# Patient Record
Sex: Female | Born: 1980 | Race: White | Hispanic: No | Marital: Married | Smoking: Never smoker
Health system: Southern US, Community
[De-identification: ages and names within clinical notes are randomized; demographics above are authoritative.]

## PROBLEM LIST (undated history)

## (undated) ENCOUNTER — Inpatient Hospital Stay (HOSPITAL_COMMUNITY): Payer: Self-pay

## (undated) DIAGNOSIS — Z8619 Personal history of other infectious and parasitic diseases: Secondary | ICD-10-CM

## (undated) DIAGNOSIS — E049 Nontoxic goiter, unspecified: Secondary | ICD-10-CM

## (undated) DIAGNOSIS — F419 Anxiety disorder, unspecified: Secondary | ICD-10-CM

## (undated) DIAGNOSIS — E039 Hypothyroidism, unspecified: Secondary | ICD-10-CM

## (undated) DIAGNOSIS — Z8742 Personal history of other diseases of the female genital tract: Secondary | ICD-10-CM

## (undated) HISTORY — PX: WISDOM TOOTH EXTRACTION: SHX21

## (undated) HISTORY — DX: Personal history of other diseases of the female genital tract: Z87.42

## (undated) HISTORY — DX: Personal history of other infectious and parasitic diseases: Z86.19

## (undated) HISTORY — PX: OOPHORECTOMY: SHX86

---

## 2010-06-11 ENCOUNTER — Inpatient Hospital Stay (HOSPITAL_COMMUNITY)
Admission: AD | Admit: 2010-06-11 | Discharge: 2010-06-14 | Payer: Self-pay | Source: Home / Self Care | Attending: Obstetrics and Gynecology | Admitting: Obstetrics and Gynecology

## 2010-06-12 ENCOUNTER — Encounter (INDEPENDENT_AMBULATORY_CARE_PROVIDER_SITE_OTHER): Payer: Self-pay | Admitting: Obstetrics and Gynecology

## 2010-06-20 LAB — URINE CULTURE
Colony Count: NO GROWTH
Culture  Setup Time: 201201082210
Culture: NO GROWTH
Special Requests: NEGATIVE

## 2010-06-20 LAB — CBC
HCT: 39 % (ref 36.0–46.0)
HCT: 28.2 % — ABNORMAL LOW (ref 36.0–46.0)
Hemoglobin: 13.5 g/dL (ref 12.0–15.0)
Hemoglobin: 9.4 g/dL — ABNORMAL LOW (ref 12.0–15.0)
MCH: 32.9 pg (ref 26.0–34.0)
MCH: 32.1 pg (ref 26.0–34.0)
MCHC: 34.6 g/dL (ref 30.0–36.0)
MCHC: 33.3 g/dL (ref 30.0–36.0)
MCV: 95.1 fL (ref 78.0–100.0)
MCV: 96.2 fL (ref 78.0–100.0)
Platelets: 208 10*3/uL (ref 150–400)
Platelets: 176 10*3/uL (ref 150–400)
RBC: 4.1 MIL/uL (ref 3.87–5.11)
RBC: 2.93 MIL/uL — ABNORMAL LOW (ref 3.87–5.11)
RDW: 13.5 % (ref 11.5–15.5)
RDW: 13.7 % (ref 11.5–15.5)
WBC: 12.1 10*3/uL — ABNORMAL HIGH (ref 4.0–10.5)
WBC: 17.9 10*3/uL — ABNORMAL HIGH (ref 4.0–10.5)

## 2010-06-20 LAB — RPR: RPR Ser Ql: NONREACTIVE

## 2010-06-20 LAB — ABO/RH: ABO/RH(D): O POS

## 2010-07-22 ENCOUNTER — Other Ambulatory Visit: Payer: Self-pay | Admitting: Obstetrics and Gynecology

## 2010-07-28 ENCOUNTER — Ambulatory Visit (HOSPITAL_COMMUNITY)
Admission: RE | Admit: 2010-07-28 | Discharge: 2010-07-28 | Disposition: A | Discharge status: Home / Self Care | Payer: BC Managed Care – PPO | Source: Ambulatory Visit | Attending: Obstetrics and Gynecology | Admitting: Obstetrics and Gynecology

## 2010-07-28 ENCOUNTER — Other Ambulatory Visit: Payer: Self-pay | Admitting: Obstetrics and Gynecology

## 2010-07-28 DIAGNOSIS — N83209 Unspecified ovarian cyst, unspecified side: Secondary | ICD-10-CM | POA: Insufficient documentation

## 2010-07-28 LAB — CBC
HCT: 41 % (ref 36.0–46.0)
Hemoglobin: 13.8 g/dL (ref 12.0–15.0)
MCH: 31.5 pg (ref 26.0–34.0)
MCHC: 33.7 g/dL (ref 30.0–36.0)
MCV: 93.6 fL (ref 78.0–100.0)
Platelets: 322 10*3/uL (ref 150–400)
RBC: 4.38 MIL/uL (ref 3.87–5.11)
RDW: 11.5 % (ref 11.5–15.5)
WBC: 6 10*3/uL (ref 4.0–10.5)

## 2010-07-28 LAB — SURGICAL PCR SCREEN
MRSA, PCR: NEGATIVE
Staphylococcus aureus: NEGATIVE

## 2010-08-18 NOTE — Op Note (Signed)
Stephanie Thompson, Stephanie Thompson            ACCOUNT NO.:  [id number]  MEDICAL RECORD NO.:  [id number]           PATIENT TYPE:  O  LOCATION:  [facility]                          [facility]  PHYSICIAN:  IT trainer, M.D.DATE OF BIRTH:  Apr 03, 1981  DATE OF PROCEDURE:  07/28/2010 DATE OF DISCHARGE:                              OPERATIVE REPORT   PREOPERATIVE DIAGNOSIS:  Left complex ovarian cyst.  POSTOPERATIVE DIAGNOSIS:  Left complex ovarian cyst.  PROCEDURE:  Robotic assisted left salpingo-oophorectomy.  ANESTHESIA:  General.  SURGEON:  Maxie Better, M.D.  PROCTOR:  Genia Del, M.D.  INDICATIONS:  A 30-year gravida 1, para 1 married white female who is about 6-1/2 weeks postpartum who presents now for surgical management of a left complex ovarian cyst.  The patient was followed during her pregnancy for what appeared to have been a simple ovarian cyst which had remained stable throughout its course.  At a postpartum visit, the patient underwent an ultrasound for followup and it revealed a 4.5 cm complex cyst with low echo.  The patient had a CA-125 done at that time which was 36.  Telephone consultation regarding the management with the GYN Oncologist Dr. Bridget Hartshorn concurred with the patient being taken to the operating room for surgical management.  The patient had been asymptomatic was no family history of ovarian cancer.  Risk of the procedure was reviewed with the patient, consent was signed, the patient was transferred to the operating room.  PROCEDURE:  Under adequate general anesthesia, the patient is placed in the dorsal lithotomy position.  Examination under anesthesia revealed slightly enlarge uterus, which was anterior.  There was a left adnexal fullness but no defined mass.  Right adnexa was without any lesion.  The patient was sterilely prepped with ChloraPrep and subsequently Betadine prep for the lower portion with an indwelling Foley  catheter subsequently placed.  She was then sterilely draped.  A weighted speculum was then placed in the vagina.  Sims retractor was used anteriorly.  The cervix which was parous was grasped with a single-tooth tenaculum.  The uterus sounded to 10 cm.  A medium KOH ring and its accompanied #10 introducer was inserted into the uterine cavity without incident.  The balloon was filled.  The KOH ring had been seated around the cervix very carefully.  The instruments from the vagina was otherwise removed.  The attention was then turned to the abdomen after maintaining still sterile technique.  A supraumbilical incision was then made.  A Veress needle was introduced.  Opening pressure of 10 was noted, about 3.5 L of CO2 was insufflated.  The Veress needle was removed after having been tested.  A 12 mm non-bladed trocar was attempted to be inserted with some difficulty and therefore a bladed trocar of 12 mm was inserted and then removed and the 12 mm non-bladed trocar was inserted.  The camera port was then utilized to place through that site and inspection of the pelvis was notable for no underlying trauma to the organs.  Obvious inspection of the pelvis revealed the uterus appearing to be otherwise bulky but normal.  Small visualization of the left  ovary did not was suggest immediate presence of a cyst and the right ovary appeared normal.  At that point, a decision was made to place a second port.  A 0.25% Marcaine was injected 8 cm away from the supraumbilical site was then placed and with good placement, a probe was then utilized to further inspect the pelvis.  The ovary on the left was inspected, it was noted to have a very elongated appearance with a clearly thin distal cystic mass giving the appearance of a clear fluid. No fluid in the cul-de-sac was noted.  The right tube and ovary was otherwise normal.  Decision was then made to continue with the procedure as scheduled.  The 3  additional sites was in place including a 12 mm assistant port on the right side of the patient with careful measurements.  Once all sites were identified as potential placement the trocars were placed under direct visualization.  The patient now was in deep Trendelenburg position.  The robot was then brought up to the patient's bedside docking to the left of the patient side docking specifically and the camera port were then attached.  A PK and the scissors and instruments were then placed through the ports 1 and 2 and a grasper was placed in the third site.  At that point, I went to the surgical console confirming the placement of the instruments in the right location as well which had been done previously.  Using the assistant port, the pelvis was irrigated with pelvic washings and 250 mL was removed for washings.  The procedure was then began by lifting up the left ovary with a grasper and using the monopolar cautery, making a linear incision to tease off the ovarian tissue from the cyst.  In doing so, there was incidental rupture of the cyst due to the thinness of the wall with clear serous like fluid present.  The site was further opened and grasping up the cyst wall using traction and counter traction with the instruments, the cyst wall was then finally removed.  Deep into the cyst itself, there was evidence of some projections, initially thought to be a small amount but as the cystectomy was being performed and opening further of the cavity, it was then noted that there was more of these front like projections.  At that point, a decision was then made to proceed with a salpingo-oophorectomy.  The ureter was identified. The tube and ovary on the left was brought up further and the infundibulopelvic ligament was serially clamped, cauterized with a PL and cut with monopolar scissors until the left tube and ovary was removed in its entirety.  An endobag was then used to place through  the assistant port and the specimen removed by that needs.  Good hemostasis subsequently noted.  The abdomen was once again inspected, anterior and posterior cul-de-sac was also noted to be without any specific findings. The right tube and ovary was once again inspected and appeared to be normal.  The appendix was noted to be normal.  At that point, we re- scrubbed to present back into case as the robotic portion was completed. The robot was then undocked and moved.  The camera remained and the pelvis inspected.  The abdomen was then copiously irrigated and suctioned with good hemostasis noted.  The procedure was felt to be complete.  The lateral port sites were then removed under direct visualization.  The camera ports was then removed.  The two 12-mm sites fascial closure  was done with 0 Vicryl figure-of-eight sutures and subcuticular closure of the skin with all the ports with 4-0 Vicryl followed by Dermabond.  The instruments in the vagina was also removed without incident.  SPECIMEN:  The pelvic washings.  The left tube and ovary sent to pathology.  ESTIMATED BLOOD LOSS:  Minimal.  COMPLICATIONS:  None.  The patient tolerated the procedure well, was transferred to recovery in stable condition.     Maxie Better, M.D.     /MEDQ  D:  07/28/2010  T:  07/29/2010  Job:  [id number]  Electronically Signed by Nena Jordan Shaye Lagace M.D. on 08/18/2010 05:38:54 AM

## 2011-09-26 ENCOUNTER — Other Ambulatory Visit: Payer: Self-pay | Admitting: Internal Medicine

## 2011-09-26 ENCOUNTER — Ambulatory Visit (HOSPITAL_COMMUNITY)
Admission: RE | Admit: 2011-09-26 | Discharge: 2011-09-26 | Disposition: A | Discharge status: Home / Self Care | Payer: BC Managed Care – PPO | Source: Ambulatory Visit | Attending: Internal Medicine | Admitting: Internal Medicine

## 2011-09-26 DIAGNOSIS — E049 Nontoxic goiter, unspecified: Secondary | ICD-10-CM

## 2011-09-26 DIAGNOSIS — E041 Nontoxic single thyroid nodule: Secondary | ICD-10-CM | POA: Insufficient documentation

## 2011-09-28 ENCOUNTER — Other Ambulatory Visit: Payer: Self-pay | Admitting: Internal Medicine

## 2011-09-28 DIAGNOSIS — E041 Nontoxic single thyroid nodule: Secondary | ICD-10-CM

## 2011-10-02 ENCOUNTER — Ambulatory Visit (HOSPITAL_COMMUNITY)
Admission: RE | Admit: 2011-10-02 | Discharge: 2011-10-02 | Disposition: A | Discharge status: Home / Self Care | Payer: BC Managed Care – PPO | Source: Ambulatory Visit | Attending: Internal Medicine | Admitting: Internal Medicine

## 2011-10-02 DIAGNOSIS — E049 Nontoxic goiter, unspecified: Secondary | ICD-10-CM | POA: Insufficient documentation

## 2011-10-02 DIAGNOSIS — E041 Nontoxic single thyroid nodule: Secondary | ICD-10-CM

## 2012-02-17 ENCOUNTER — Encounter (HOSPITAL_COMMUNITY): Payer: Self-pay | Admitting: *Deleted

## 2012-02-17 ENCOUNTER — Inpatient Hospital Stay (HOSPITAL_COMMUNITY)
Admission: AD | Admit: 2012-02-17 | Discharge: 2012-02-17 | Disposition: A | Discharge status: Home / Self Care | Payer: BC Managed Care – PPO | Source: Ambulatory Visit | Attending: Obstetrics and Gynecology | Admitting: Obstetrics and Gynecology

## 2012-02-17 ENCOUNTER — Inpatient Hospital Stay (HOSPITAL_COMMUNITY): Payer: BC Managed Care – PPO

## 2012-02-17 DIAGNOSIS — O200 Threatened abortion: Secondary | ICD-10-CM

## 2012-02-17 HISTORY — DX: Nontoxic goiter, unspecified: E04.9

## 2012-02-17 LAB — CBC
HCT: 37.4 % (ref 36.0–46.0)
Hemoglobin: 12.8 g/dL (ref 12.0–15.0)
MCH: 31.5 pg (ref 26.0–34.0)
MCHC: 34.2 g/dL (ref 30.0–36.0)
MCV: 92.1 fL (ref 78.0–100.0)
Platelets: 313 10*3/uL (ref 150–400)
RBC: 4.06 MIL/uL (ref 3.87–5.11)
RDW: 11.6 % (ref 11.5–15.5)
WBC: 10.1 10*3/uL (ref 4.0–10.5)

## 2012-02-17 LAB — HCG, QUANTITATIVE, PREGNANCY: hCG, Beta Chain, Quant, S: 92949 m[IU]/mL — ABNORMAL HIGH (ref <5)

## 2012-02-17 NOTE — MAU Note (Signed)
Pt reports having some spotting that started on Thursday was more brown and now pink. Denies pain or cramping.

## 2012-02-17 NOTE — MAU Provider Note (Signed)
History     CSN: [id number]  Arrival date and time: 02/17/12 1326 On unit - aware of patient arrival - ready for provider exam ~1400 First Provider Initiated Contact with Patient 02/17/12 1412      Chief Complaint  Patient presents with  . Vaginal Bleeding   HPI  Brown spotting [place] beach.  Spotting transitioned to pink spotting until this morning.  No cramping or pain.  No intercourse in 2 weeks.  Past Medical History  Diagnosis Date  . Enlarged thyroid     Past Surgical History  Procedure Date  . Oophorectomy             2012 -left History reviewed. No pertinent family history.  History  Substance Use Topics  . Smoking status: Never Smoker   . Smokeless tobacco: Not on file  . Alcohol Use: No    Allergies: No Known Allergies  Prescriptions prior to admission  Medication Sig Dispense Refill  . calcium carbonate (OS-CAL) 600 MG TABS Take 600 mg by mouth every morning.      . docusate sodium (COLACE) 100 MG capsule Take 100 mg by mouth daily as needed. For constipation      . ondansetron (ZOFRAN-ODT) 8 MG disintegrating tablet Take 8 mg by mouth daily as needed. For nausea      . Prenatal Vit-Fe Fumarate-FA (PRENATAL MULTIVITAMIN) TABS Take 1 tablet by mouth daily.        ROS Physical Exam   Blood pressure 123/71, pulse 81, temperature 98.2 F (36.8 C), temperature source Oral, resp. rate 18, height 5\' 4"  (1.626 m), weight 67.223 kg (148 lb 3.2 oz), last menstrual period 12/16/2011.  Physical Exam General: A&O x 3, anxious Abdomen: soft, non-tender, no guarding Pelvic: scant pinkish, brown d/c noted on SSE, no frank bleeding from cervical os, cervix deviated to patient's LT, cervix pink, smooth, appears closed Bimanual Exam: no CMT, uterus 9 week size, smooth, no palpable masses FHTs unaudible with doppler  Blood type: O pos per [facility] lab (2012)   MAU Course  Procedures Ultrasound:RADIOLOGY REPORT*  Clinical Data: 31 year old G2 P1,  LMP 12/16/2011 (9 weeks 0 days),  presenting with vaginal spotting over the past 2 days. No audible  fetal heart tones at physical examination.  OBSTETRIC <14 WK ULTRASOUND  Technique: Transabdominal ultrasound was performed for evaluation  of the gestation as well as the maternal uterus and adnexal  regions.  Comparison: None.  Intrauterine gestational sac: Single, normal in appearance.  Yolk sac: Visualized.  Embryo: Visualized.  Cardiac Activity: Visualized.  Heart Rate: 190 bpm  CRL: 28.5 mm 9 w 5 d [place] EDC: 09/16/2012.  Maternal uterus/Adnexae:  Normal-appearing right ovary measuring approximately 3.6 x 2.7 x  2.5 cm, containing an approximate 2.5 cm simple cyst. Left ovary  surgically absent. No adnexal masses or free pelvic fluid.  IMPRESSION:  1. Single live intrauterine embryo with estimated gestational age  [redacted] weeks 5 days by crown-rump length. Ultrasound [facility] 09/16/2012.  This correlates well with the estimated gestational age by LMP of 9  weeks 0 days.  2. Normal-appearing right ovary containing a 2.5 cm corpus luteum  cyst. Surgically absent left ovary.  3. No adnexal masses or free pelvic fluid.    Assessment and Plan  Threatened Abortion  Plan: Beta HcG - pending CBC: 10.1-12.8 - 37.4 - 313 Pelvic Ultrasound : IUP at 9 weeks and 5 days / CLC on right / no evidence of [facility]  Expectant management  Miscarriage precautions Discharge to home  Marlinda Mike 02/17/2012, 2:20 PM  Exam by: Raelyn Mora, SNM

## 2012-02-17 NOTE — MAU Note (Signed)
Pt presents to MAU with chief complaint of vagina spotting. Pt is a G2P1 at [redacted]w[redacted]d; says the spotting started on Thursday, brown, scant amount. Pt denies cramping/abdominal pain. Pt called the office on Thurs and the nurse told her to monitor the spotting and if it got worse to call back. Pt says this morning she woke up and the spotting had increased and the color is now pink.

## 2012-02-21 LAB — OB RESULTS CONSOLE ANTIBODY SCREEN: Antibody Screen: NEGATIVE

## 2012-02-21 LAB — OB RESULTS CONSOLE ABO/RH

## 2012-02-21 LAB — OB RESULTS CONSOLE RUBELLA ANTIBODY, IGM: Rubella: IMMUNE

## 2012-02-21 LAB — OB RESULTS CONSOLE HIV ANTIBODY (ROUTINE TESTING): HIV: NONREACTIVE

## 2012-02-29 LAB — OB RESULTS CONSOLE GC/CHLAMYDIA
Chlamydia: NEGATIVE
Gonorrhea: NEGATIVE

## 2012-06-05 NOTE — L&D Delivery Note (Signed)
Delivery Note At 6:40 PM a viable and healthy female was delivered via Vaginal, Spontaneous Delivery (Presentation: Right Occiput Anterior).  APGAR: 8, 9; weight . pending  Placenta status: Intact, Spontaneous.  Cord: 3 vessels with the following complications: None.  Cord pH: not sent  Anesthesia: Epidural  Episiotomy: None Lacerations: 2nd degree;Perineal Suture Repair: 3.0 vicryl rapide Est. Blood Loss (mL): 350 Prophylactic 800 mcg Cytotec placed rectally due to prior h/o PPH'age per pt and LGA baby.   Mom to postpartum.  Baby to with mother, stable. Marland Kitchen  Oral Hallgren R 09/13/2012, 7:13 PM

## 2012-07-21 ENCOUNTER — Encounter (HOSPITAL_COMMUNITY): Payer: Self-pay | Admitting: *Deleted

## 2012-07-21 ENCOUNTER — Inpatient Hospital Stay (HOSPITAL_COMMUNITY)
Admission: AD | Admit: 2012-07-21 | Discharge: 2012-07-21 | Disposition: A | Discharge status: Home / Self Care | Payer: BC Managed Care – PPO | Source: Ambulatory Visit | Attending: Obstetrics and Gynecology | Admitting: Obstetrics and Gynecology

## 2012-07-21 DIAGNOSIS — R197 Diarrhea, unspecified: Secondary | ICD-10-CM | POA: Insufficient documentation

## 2012-07-21 DIAGNOSIS — R109 Unspecified abdominal pain: Secondary | ICD-10-CM | POA: Insufficient documentation

## 2012-07-21 DIAGNOSIS — O212 Late vomiting of pregnancy: Secondary | ICD-10-CM | POA: Insufficient documentation

## 2012-07-21 LAB — URINE MICROSCOPIC-ADD ON

## 2012-07-21 LAB — COMPREHENSIVE METABOLIC PANEL
ALT: 15 U/L (ref 0–35)
AST: 19 U/L (ref 0–37)
Albumin: 2.7 g/dL — ABNORMAL LOW (ref 3.5–5.2)
Alkaline Phosphatase: 92 U/L (ref 39–117)
BUN: 7 mg/dL (ref 6–23)
CO2: 20 mEq/L (ref 19–32)
Calcium: 8.2 mg/dL — ABNORMAL LOW (ref 8.4–10.5)
Chloride: 101 mEq/L (ref 96–112)
Creatinine, Ser: 0.5 mg/dL (ref 0.50–1.10)
GFR calc Af Amer: >90 mL/min (ref >90)
GFR calc non Af Amer: >90 mL/min (ref >90)
Glucose, Bld: 115 mg/dL — ABNORMAL HIGH (ref 70–99)
Potassium: 3.1 mEq/L — ABNORMAL LOW (ref 3.5–5.1)
Sodium: 135 mEq/L (ref 135–145)
Total Bilirubin: 0.7 mg/dL (ref 0.3–1.2)
Total Protein: 5.8 g/dL — ABNORMAL LOW (ref 6.0–8.3)

## 2012-07-21 LAB — CBC WITH DIFFERENTIAL/PLATELET
Basophils Absolute: 0 10*3/uL (ref 0.0–0.1)
Basophils Relative: 0 % (ref 0–1)
Eosinophils Absolute: 0 10*3/uL (ref 0.0–0.7)
Eosinophils Relative: 0 % (ref 0–5)
HCT: 36.3 % (ref 36.0–46.0)
Hemoglobin: 12.4 g/dL (ref 12.0–15.0)
Lymphocytes Relative: 6 % — ABNORMAL LOW (ref 12–46)
Lymphs Abs: 0.8 10*3/uL (ref 0.7–4.0)
MCH: 32.5 pg (ref 26.0–34.0)
MCHC: 34.2 g/dL (ref 30.0–36.0)
MCV: 95.3 fL (ref 78.0–100.0)
Monocytes Absolute: 0.7 10*3/uL (ref 0.1–1.0)
Monocytes Relative: 6 % (ref 3–12)
Neutro Abs: 11.2 10*3/uL — ABNORMAL HIGH (ref 1.7–7.7)
Neutrophils Relative %: 88 % — ABNORMAL HIGH (ref 43–77)
Platelets: 216 10*3/uL (ref 150–400)
RBC: 3.81 MIL/uL — ABNORMAL LOW (ref 3.87–5.11)
RDW: 12.6 % (ref 11.5–15.5)
WBC: 12.8 10*3/uL — ABNORMAL HIGH (ref 4.0–10.5)

## 2012-07-21 LAB — URINALYSIS, ROUTINE W REFLEX MICROSCOPIC
Glucose, UA: NEGATIVE mg/dL
Hgb urine dipstick: NEGATIVE
Ketones, ur: >80 mg/dL — AB
Nitrite: NEGATIVE
Protein, ur: 30 mg/dL — AB
Specific Gravity, Urine: 1.025 (ref 1.005–1.030)
Urobilinogen, UA: 0.2 mg/dL (ref 0.0–1.0)
pH: 6.5 (ref 5.0–8.0)

## 2012-07-21 MED ORDER — ONDANSETRON 8 MG PO TBDP: 8.0000 mg | ORAL_TABLET | Freq: Two times a day (BID) | ORAL | Status: DC | PRN

## 2012-07-21 MED ORDER — LACTATED RINGERS IV SOLN
INTRAVENOUS | Status: DC
  Administered 2012-07-21: 18:00:00 via INTRAVENOUS

## 2012-07-21 MED ORDER — FAMOTIDINE IN NACL 20-0.9 MG/50ML-% IV SOLN
20.0000 mg | Freq: Once | INTRAVENOUS | Status: AC
  Administered 2012-07-21: 20 mg via INTRAVENOUS

## 2012-07-21 MED ORDER — ONDANSETRON 8 MG/NS 50 ML IVPB
8.0000 mg | Freq: Once | INTRAVENOUS | Status: AC
  Administered 2012-07-21: 8 mg via INTRAVENOUS
  Filled 2012-07-21: qty 8

## 2012-07-21 MED ORDER — KCL-LACTATED RINGERS 20 MEQ/L IV SOLN
INTRAVENOUS | Status: DC
  Administered 2012-07-21: 20:00:00 via INTRAVENOUS
  Filled 2012-07-21: qty 1000

## 2012-07-21 MED ORDER — RANITIDINE HCL 150 MG PO TABS: 150.0000 mg | ORAL_TABLET | Freq: Two times a day (BID) | ORAL | Status: DC

## 2012-07-21 MED ORDER — FAMOTIDINE IN NACL 20-0.9 MG/50ML-% IV SOLN
INTRAVENOUS | Status: AC
  Filled 2012-07-21: qty 50

## 2012-07-21 NOTE — MAU Note (Signed)
Stephanie Thompson is here with N/V/D that started last night. She has been up all night with the symptoms. She is 31w1; she has been sipping on stuff but complains of horrible upper abdominal pain, and tingling in her hands.

## 2012-07-21 NOTE — Progress Notes (Signed)
Nursing requested initial orders for this patient - Dr Ernestina Penna en route to evaluate. Orders placed   Marlinda Mike CNM

## 2012-07-21 NOTE — H&P (Signed)
Chief complaint: Nausea, vomiting, diarrhea, dehydration  History of present illness: This 32 year old G2 P1 at 31 weeks and 1 day presents with nausea vomiting diarrhea and dehydration. Patient notes symptoms started early this morning with decreased by mouth it intake. Symptoms worsened around noon today. Patient went all afternoon and early evening with very little by mouth intake. Several bouts of nausea emesis and diarrhea. Patient notes no fevers. Patient notes no bloody stools or blood in her urine. Patient notes no dysuria. Patient notes no fever. Patient notes no headache, no right upper quadrant pain, no vaginal bleeding. Good fetal movement. Occasional contraction, not painful. No complaints of swelling. Patient with no known sick contacts. Patient states throughout the day minimal urine output and overall feeling fatigued.  No known drug allergies Medications: Prenatal vitamins, Tums Allergies none Medical history: None Past surgical history: Foot surgery Past obstructive history: Vaginal delivery at 41 weeks Past GYN history: No history of LEEP  Physical exam:  Filed Vitals:   07/21/12 1812 07/21/12 1830 07/21/12 1850 07/21/12 2016  BP:    112/68  Pulse: 105 95 98 100  Temp:      TempSrc:      Resp:      Weight:      SpO2: 99% 100% 99%    General: Well-appearing, no distress  cardiovascular regular rate and rhythm Pulmonary: Clear to auscultation bilaterally Back: No costovertebral angle tenderness Abdomen: Gravid, nontender, no right upper quadrant pain, no rebound, no guarding GU: Cervix long closed, no cervical motion tenderness, no uterine tenderness Lower extremities: Nontender, no edema  NST: 150s, positive accelerations, no decelerations, 10 beat variability Tocometry: Regular contractions every 5 minutes (not felt by patient) versus irritability  CBC: Hemoglobin 12.4, white blood cell 12.8, 88% neutrophils UA: 80 of ketones, specific gravity 1.025, 30 CMP:  Potassium 3.1, LFTs normal with the exception of low albumin  Assessment and plan: 32 year old G2 P1 at 31 weeks this viral GI illness and subsequent dehydration with associated uterine irritability - Uterine irritability. No evidence of preterm labor, no risk factors for preterm labor, no cervical change. Patient given precautions to return if contractions are more painful, vaginal bleeding, leakage of fluid. - GI illness. Likely viral in nature. Patient was discharged home after IV hydration, tolerating by mouth. Patient was given Zantac and Zofran. Instructed to continue hydration at home - Hypokalemia. Repleted with IV fluids  Heran Campau A. 07/21/2012 9:26 PM

## 2012-07-23 LAB — URINE CULTURE: Colony Count: 60000

## 2012-08-14 ENCOUNTER — Other Ambulatory Visit: Payer: Self-pay | Admitting: Endocrinology

## 2012-08-14 DIAGNOSIS — E041 Nontoxic single thyroid nodule: Secondary | ICD-10-CM

## 2012-08-20 LAB — OB RESULTS CONSOLE GBS: GBS: NEGATIVE

## 2012-09-11 ENCOUNTER — Encounter (HOSPITAL_COMMUNITY): Payer: Self-pay | Admitting: *Deleted

## 2012-09-11 ENCOUNTER — Telehealth (HOSPITAL_COMMUNITY): Payer: Self-pay | Admitting: *Deleted

## 2012-09-11 NOTE — Telephone Encounter (Signed)
Preadmission screen  

## 2012-09-13 ENCOUNTER — Inpatient Hospital Stay (HOSPITAL_COMMUNITY)
Admission: AD | Admit: 2012-09-13 | Discharge: 2012-09-15 | DRG: 373 | Disposition: A | Discharge status: Home / Self Care | Payer: BC Managed Care – PPO | Source: Ambulatory Visit | Attending: Obstetrics and Gynecology | Admitting: Obstetrics and Gynecology

## 2012-09-13 ENCOUNTER — Inpatient Hospital Stay (HOSPITAL_COMMUNITY): Payer: BC Managed Care – PPO | Admitting: Anesthesiology

## 2012-09-13 ENCOUNTER — Other Ambulatory Visit: Payer: Self-pay | Admitting: Obstetrics and Gynecology

## 2012-09-13 ENCOUNTER — Encounter (HOSPITAL_COMMUNITY): Payer: Self-pay | Admitting: Obstetrics and Gynecology

## 2012-09-13 ENCOUNTER — Encounter (HOSPITAL_COMMUNITY): Payer: Self-pay | Admitting: Anesthesiology

## 2012-09-13 DIAGNOSIS — O3660X0 Maternal care for excessive fetal growth, unspecified trimester, not applicable or unspecified: Secondary | ICD-10-CM | POA: Diagnosis present

## 2012-09-13 DIAGNOSIS — K219 Gastro-esophageal reflux disease without esophagitis: Secondary | ICD-10-CM | POA: Diagnosis present

## 2012-09-13 LAB — CBC
HCT: 39.3 % (ref 36.0–46.0)
Hemoglobin: 13.7 g/dL (ref 12.0–15.0)
MCH: 32.6 pg (ref 26.0–34.0)
MCHC: 34.9 g/dL (ref 30.0–36.0)

## 2012-09-13 MED ORDER — EPHEDRINE 5 MG/ML INJ
10.0000 mg | INTRAVENOUS | Status: DC | PRN
  Filled 2012-09-13: qty 4
  Filled 2012-09-13: qty 2

## 2012-09-13 MED ORDER — LANOLIN HYDROUS EX OINT: TOPICAL_OINTMENT | CUTANEOUS | Status: DC | PRN

## 2012-09-13 MED ORDER — SIMETHICONE 80 MG PO CHEW: 80.0000 mg | CHEWABLE_TABLET | ORAL | Status: DC | PRN

## 2012-09-13 MED ORDER — LACTATED RINGERS IV SOLN: INTRAVENOUS | Status: DC

## 2012-09-13 MED ORDER — LACTATED RINGERS IV SOLN: 500.0000 mL | INTRAVENOUS | Status: DC | PRN

## 2012-09-13 MED ORDER — DIBUCAINE 1 % RE OINT: 1.0000 "application " | TOPICAL_OINTMENT | RECTAL | Status: DC | PRN

## 2012-09-13 MED ORDER — EPHEDRINE 5 MG/ML INJ
10.0000 mg | INTRAVENOUS | Status: DC | PRN
  Filled 2012-09-13: qty 2

## 2012-09-13 MED ORDER — PRENATAL MULTIVITAMIN CH
1.0000 | ORAL_TABLET | Freq: Every day | ORAL | Status: DC
  Administered 2012-09-14: 1 via ORAL
  Filled 2012-09-13 (×2): qty 1

## 2012-09-13 MED ORDER — PHENYLEPHRINE 40 MCG/ML (10ML) SYRINGE FOR IV PUSH (FOR BLOOD PRESSURE SUPPORT)
80.0000 ug | PREFILLED_SYRINGE | INTRAVENOUS | Status: DC | PRN
  Filled 2012-09-13: qty 5
  Filled 2012-09-13: qty 2

## 2012-09-13 MED ORDER — DIPHENHYDRAMINE HCL 25 MG PO CAPS: 25.0000 mg | ORAL_CAPSULE | Freq: Four times a day (QID) | ORAL | Status: DC | PRN

## 2012-09-13 MED ORDER — OXYCODONE-ACETAMINOPHEN 5-325 MG PO TABS: 1.0000 | ORAL_TABLET | ORAL | Status: DC | PRN

## 2012-09-13 MED ORDER — FENTANYL 2.5 MCG/ML BUPIVACAINE 1/10 % EPIDURAL INFUSION (WH - ANES)
14.0000 mL/h | INTRAMUSCULAR | Status: DC | PRN
  Filled 2012-09-13: qty 125

## 2012-09-13 MED ORDER — DIPHENHYDRAMINE HCL 50 MG/ML IJ SOLN: 12.5000 mg | INTRAMUSCULAR | Status: DC | PRN

## 2012-09-13 MED ORDER — BENZOCAINE-MENTHOL 20-0.5 % EX AERO: 1.0000 "application " | INHALATION_SPRAY | CUTANEOUS | Status: DC | PRN

## 2012-09-13 MED ORDER — OXYTOCIN BOLUS FROM INFUSION
500.0000 mL | INTRAVENOUS | Status: DC
  Administered 2012-09-13: 500 mL via INTRAVENOUS

## 2012-09-13 MED ORDER — CITRIC ACID-SODIUM CITRATE 334-500 MG/5ML PO SOLN: 30.0000 mL | ORAL | Status: DC | PRN

## 2012-09-13 MED ORDER — ONDANSETRON HCL 4 MG/2ML IJ SOLN: 4.0000 mg | INTRAMUSCULAR | Status: DC | PRN

## 2012-09-13 MED ORDER — IBUPROFEN 600 MG PO TABS
600.0000 mg | ORAL_TABLET | Freq: Four times a day (QID) | ORAL | Status: DC
  Administered 2012-09-14 – 2012-09-15 (×4): 600 mg via ORAL
  Filled 2012-09-13 (×6): qty 1

## 2012-09-13 MED ORDER — MISOPROSTOL 200 MCG PO TABS
ORAL_TABLET | ORAL | Status: AC
  Administered 2012-09-13: 800 ug
  Filled 2012-09-13: qty 4

## 2012-09-13 MED ORDER — WITCH HAZEL-GLYCERIN EX PADS: 1.0000 "application " | MEDICATED_PAD | CUTANEOUS | Status: DC | PRN

## 2012-09-13 MED ORDER — ACETAMINOPHEN 325 MG PO TABS: 650.0000 mg | ORAL_TABLET | ORAL | Status: DC | PRN

## 2012-09-13 MED ORDER — LACTATED RINGERS IV SOLN
500.0000 mL | Freq: Once | INTRAVENOUS | Status: AC
  Administered 2012-09-13: 500 mL via INTRAVENOUS

## 2012-09-13 MED ORDER — LIDOCAINE HCL (PF) 1 % IJ SOLN
30.0000 mL | INTRAMUSCULAR | Status: DC | PRN
  Filled 2012-09-13: qty 30

## 2012-09-13 MED ORDER — LIDOCAINE HCL (PF) 1 % IJ SOLN
INTRAMUSCULAR | Status: AC
  Administered 2012-09-13: 30 mL via SUBCUTANEOUS
  Filled 2012-09-13: qty 30

## 2012-09-13 MED ORDER — ZOLPIDEM TARTRATE 5 MG PO TABS: 5.0000 mg | ORAL_TABLET | Freq: Every evening | ORAL | Status: DC | PRN

## 2012-09-13 MED ORDER — ONDANSETRON HCL 4 MG PO TABS: 4.0000 mg | ORAL_TABLET | ORAL | Status: DC | PRN

## 2012-09-13 MED ORDER — FLEET ENEMA 7-19 GM/118ML RE ENEM: 1.0000 | ENEMA | RECTAL | Status: DC | PRN

## 2012-09-13 MED ORDER — ONDANSETRON HCL 4 MG/2ML IJ SOLN: 4.0000 mg | Freq: Four times a day (QID) | INTRAMUSCULAR | Status: DC | PRN

## 2012-09-13 MED ORDER — IBUPROFEN 600 MG PO TABS: 600.0000 mg | ORAL_TABLET | Freq: Four times a day (QID) | ORAL | Status: DC | PRN

## 2012-09-13 MED ORDER — TETANUS-DIPHTH-ACELL PERTUSSIS 5-2.5-18.5 LF-MCG/0.5 IM SUSP: 0.5000 mL | Freq: Once | INTRAMUSCULAR | Status: DC

## 2012-09-13 MED ORDER — OXYTOCIN 40 UNITS IN LACTATED RINGERS INFUSION - SIMPLE MED
62.5000 mL/h | INTRAVENOUS | Status: DC
  Filled 2012-09-13: qty 1000

## 2012-09-13 MED ORDER — PHENYLEPHRINE 40 MCG/ML (10ML) SYRINGE FOR IV PUSH (FOR BLOOD PRESSURE SUPPORT)
80.0000 ug | PREFILLED_SYRINGE | INTRAVENOUS | Status: DC | PRN
  Filled 2012-09-13: qty 2

## 2012-09-13 MED ORDER — SENNOSIDES-DOCUSATE SODIUM 8.6-50 MG PO TABS
2.0000 | ORAL_TABLET | Freq: Every day | ORAL | Status: DC
  Administered 2012-09-13 – 2012-09-14 (×2): 2 via ORAL

## 2012-09-13 NOTE — MAU Note (Signed)
Spoke with Norva Riffle CN/BS and patient will be triaged in  Room 173. Patient ambulated to room 173.

## 2012-09-13 NOTE — Anesthesia Preprocedure Evaluation (Signed)
Anesthesia Evaluation  Patient identified by MRN, date of birth, ID band Patient awake    Reviewed: Allergy & Precautions, H&P , Patient's Chart, lab work & pertinent test results  Airway Mallampati: III TM Distance: >3 FB Neck ROM: full    Dental no notable dental hx. (+) Teeth Intact   Pulmonary neg pulmonary ROS,  breath sounds clear to auscultation  Pulmonary exam normal       Cardiovascular negative cardio ROS  Rhythm:regular Rate:Normal     Neuro/Psych negative neurological ROS  negative psych ROS   GI/Hepatic Neg liver ROS, GERD-  Medicated and Controlled,  Endo/Other  negative endocrine ROS  Renal/GU negative Renal ROS  negative genitourinary   Musculoskeletal   Abdominal Normal abdominal exam  (+)   Peds  Hematology negative hematology ROS (+)   Anesthesia Other Findings   Reproductive/Obstetrics (+) Pregnancy                          Anesthesia Physical Anesthesia Plan  ASA: II  Anesthesia Plan: Epidural   Post-op Pain Management:    Induction:   Airway Management Planned:   Additional Equipment:   Intra-op Plan:   Post-operative Plan:   Informed Consent: I have reviewed the patients History and Physical, chart, labs and discussed the procedure including the risks, benefits and alternatives for the proposed anesthesia with the patient or authorized representative who has indicated his/her understanding and acceptance.     Plan Discussed with: Anesthesiologist  Anesthesia Plan Comments:         Anesthesia Quick Evaluation   

## 2012-09-13 NOTE — OB Triage Note (Signed)
Patient admitted to 173 for triage to rule out active labor. Patient stated she had been contracting since 0630 this morning. They have become more frequent, and she timed them between 5 and 10 minutes apart and increasing in frequency. She made an appointment with Dr. Ernestina Penna and she was 4cm/50%/-2. She went home, and felt the contractions were stronger, so she came to MAU.

## 2012-09-13 NOTE — H&P (Signed)
Stephanie Thompson is a 32 y.o. female presenting at 38.6 wks in active labor.  G2P1001, prior term delivery of 8'9" and suspecting macrosomia with 9+ lbs with current pregnancy PNCare with Stephanie Thompson, starting at 8 wks. Overall uncomplicated with normal labs, Glucola nl x 2, sono noted EFW at 98% at 25 wks and has been measuring over 90%.   History OB History   Grav Para Term Preterm Abortions TAB SAB Ect Mult Living   2 1 1  0 0 0 0 0 0 1     Past Medical History  Diagnosis Date  . Enlarged thyroid   . GERD (gastroesophageal reflux disease)   . Hx of ovarian cyst   . Hx of varicella    Past Surgical History  Procedure Laterality Date  . Oophorectomy     Family History: family history includes Cancer in her father, maternal grandmother, and paternal grandmother and Hyperthyroidism in her mother. Social History:  reports that she has never smoked. She does not have any smokeless tobacco history on file. She reports that she does not drink alcohol or use illicit drugs.   Prenatal Transfer Tool  Maternal Diabetes: No Genetic Screening: Normal Maternal Ultrasounds/Referrals: Normal Fetal Ultrasounds or other Referrals:  None Maternal Substance Abuse:  No Significant Maternal Medications:  Meds include: Other: Ranitidine Significant Maternal Lab Results:  Lab values include: Group B Strep negative Other Comments:  None  ROS - good FMs, no loss of fluid, no bleeding. No other complaints.     Blood pressure 124/79, pulse 84, temperature 98.7 F (37.1 C), temperature source Oral, resp. rate 20, height 5\' 4"  (1.626 m), weight 182 lb (82.555 kg), last menstrual period 12/16/2011. Exam Physical Exam :  A&O x 3, no acute distress. Pleasant Lungs CTA bilat CV RRR, S1S2 normal Abdo soft, non tender, non acute Extr no edema/ tenderness Pelvic 4cm dilated per office exam FHT  140s/ + accels/ no decels/ moderate variability- category I Toco regular q 2-3 min, spontaneous   Prenatal  labs: ABO, Rh: O/Positive/-- (09/18 0000) Antibody: Negative (09/18 0000) Rubella: Immune (09/18 0000) RPR: Nonreactive (09/18 0000)  HBsAg: Negative (09/18 0000)  HIV: Non-reactive (09/18 0000)  GBS: Negative (03/18 0000)  Glucola 1st 138, repeat in few wks normal Genetic screening Anatomy sono- nl,  Several growth sono noted LGA at 98% (last sono at 37 wks noted 9 lbs)  Assessment/Plan: 32 yo, G2P1001, at 38.6 wks in spontaneous labor. Routine admission orders, GBS neg. Epidural desired, AROm after that. FHT category I   Stephanie Thompson R 09/13/2012, 5:48 PM

## 2012-09-14 LAB — CBC
HCT: 35.8 % — ABNORMAL LOW (ref 36.0–46.0)
Hemoglobin: 12.1 g/dL (ref 12.0–15.0)
MCHC: 33.8 g/dL (ref 30.0–36.0)
MCV: 93.7 fL (ref 78.0–100.0)

## 2012-09-14 MED ORDER — FAMOTIDINE 20 MG PO TABS
20.0000 mg | ORAL_TABLET | Freq: Two times a day (BID) | ORAL | Status: DC
  Administered 2012-09-14 – 2012-09-15 (×2): 20 mg via ORAL
  Filled 2012-09-14 (×2): qty 1

## 2012-09-14 NOTE — Progress Notes (Signed)
Post Partum Day 1 Subjective: no complaints, up ad lib, voiding and tolerating PO, cramps well controlled. Acid reflux noted, needs to restart Zantac/   Objective: Blood pressure 119/76, pulse 77, temperature 98 F (36.7 C), temperature source Oral, resp. rate 20, height 5\' 4"  (1.626 m), weight 182 lb (82.555 kg), last menstrual period 12/16/2011, SpO2 96.00%, unknown if currently breastfeeding.  Physical Exam:  General: alert, cooperative and appears stated age Lochia: appropriate Uterine Fundus: firm Incision: healing well DVT Evaluation: No evidence of DVT seen on physical exam.   Recent Labs  09/13/12 1714 09/14/12 0726  HGB 13.7 12.1  HCT 39.3 35.8*  Stable H/H.  Rh (+) Rub Imm  TDaP- not done in 3rd trimester but received one in Apr'2013, so she is deferring after discussing with Dr Cherly Hensen   Assessment/Plan: Plan for discharge tomorrow, Breastfeeding and Lactation consult Restart Zantac.    LOS: 1 day   Rudy Luhmann R 09/14/2012, 9:41 PM

## 2012-09-14 NOTE — Anesthesia Postprocedure Evaluation (Signed)
  Anesthesia Post-op Note  Patient: Stephanie Thompson  Procedure(s) Performed: * No procedures listed *  Patient Location: Mother/Baby  Anesthesia Type:Epidural  Level of Consciousness: awake, alert  and oriented  Airway and Oxygen Therapy: Patient Spontanous Breathing  Post-op Pain: mild  Post-op Assessment: Patient's Cardiovascular Status Stable, Respiratory Function Stable, Patent Airway, No signs of Nausea or vomiting, Pain level controlled, No headache, No residual numbness and No residual motor weakness  Post-op Vital Signs: stable  Complications: No apparent anesthesia complications

## 2012-09-15 MED ORDER — IBUPROFEN 600 MG PO TABS: 600.0000 mg | ORAL_TABLET | Freq: Four times a day (QID) | ORAL | Status: DC

## 2012-09-15 NOTE — Discharge Summary (Signed)
Obstetric Discharge Summary Reason for Admission: onset of labor Prenatal Procedures: none Intrapartum Procedures: spontaneous vaginal delivery Postpartum Procedures: none Complications-Operative and Postpartum: none Hemoglobin  Date Value Range Status  09/14/2012 12.1  12.0 - 15.0 g/dL Final     HCT  Date Value Range Status  09/14/2012 35.8* 36.0 - 46.0 % Final   Discharge Diagnoses: Term Pregnancy-delivered  Discharge Information: Date: 09/15/2012 Activity: unrestricted and pelvic rest Diet: routine Medications: Ibuprofen Condition: stable Instructions: refer to practice specific booklet Discharge to: home Follow-up Information   Follow up with [name],[name], MD In 6 weeks.   Contact information:   [address] [place] [address] [phone]       Newborn Data: Live born female  Birth Weight: 7 lb 15.3 oz (3610 g) APGAR: 8, 9  Home with motherz.  Stephanie Thompson 09/15/2012, 10:32 AM

## 2012-09-15 NOTE — Progress Notes (Signed)
Post Partum Day 2 Subjective: no complaints, up ad lib, voiding, tolerating PO and + flatus  No breast complaints.    Objective: Blood pressure 108/73, pulse 80, temperature 97.7 F (36.5 C), temperature source Oral, resp. rate 18, height 5\' 4"  (1.626 m), weight 182 lb (82.555 kg), last menstrual period 12/16/2011, SpO2 96.00%, unknown if currently breastfeeding.  Physical Exam:  General: alert and cooperative Lochia: appropriate Uterine Fundus: firm Incision: n/a DVT Evaluation: No evidence of DVT seen on physical exam.   Recent Labs  09/13/12 1714 09/14/12 0726  HGB 13.7 12.1  HCT 39.3 35.8*  Stable H/H  Assessment/Plan: Discharge home and Breastfeeding Postpartum care reviewed. F/up 6 wks.  TDaP 1 yr back, deferring today.    LOS: 2 days   Leani Myron R 09/15/2012, 10:27 AM

## 2012-09-16 MED ORDER — FENTANYL 2.5 MCG/ML BUPIVACAINE 1/10 % EPIDURAL INFUSION (WH - ANES)
INTRAMUSCULAR | Status: DC | PRN
  Administered 2012-09-13: 14 mL/h via EPIDURAL

## 2012-09-16 MED ORDER — LIDOCAINE HCL (PF) 1 % IJ SOLN
INTRAMUSCULAR | Status: DC | PRN
  Administered 2012-09-13 (×2): 4 mL

## 2012-09-16 NOTE — Anesthesia Procedure Notes (Signed)
Epidural Patient location during procedure: OB Start time: 09/13/2012 5:42 PM  Staffing Anesthesiologist: Yuliana Vandrunen A. Performed by: anesthesiologist   Preanesthetic Checklist Completed: patient identified, site marked, surgical consent, pre-op evaluation, timeout performed, IV checked, risks and benefits discussed and monitors and equipment checked  Epidural Patient position: sitting Prep: site prepped and draped and DuraPrep Patient monitoring: continuous pulse ox and blood pressure Approach: midline Injection technique: LOR air  Needle:  Needle type: Tuohy  Needle gauge: 17 G Needle length: 9 cm and 9 Needle insertion depth: 5 cm cm Catheter type: closed end flexible Catheter size: 19 Gauge Catheter at skin depth: 10 cm Test dose: negative and Other  Assessment Events: blood not aspirated, injection not painful, no injection resistance, negative IV test and no paresthesia  Additional Notes Patient identified. Risks and benefits discussed including failed block, incomplete  Pain control, post dural puncture headache, nerve damage, paralysis, blood pressure Changes, nausea, vomiting, reactions to medications-both toxic and allergic and post Partum back pain. All questions were answered. Patient expressed understanding and wished to proceed. Sterile technique was used throughout procedure. Epidural site was Dressed with sterile barrier dressing. No paresthesias, signs of intravascular injection Or signs of intrathecal spread were encountered.  Patient was more comfortable after the epidural was dosed. Please see RN's note for documentation of vital signs and FHR which are stable.

## 2012-09-21 ENCOUNTER — Inpatient Hospital Stay (HOSPITAL_COMMUNITY): Admission: RE | Admit: 2012-09-21 | Payer: BC Managed Care – PPO | Source: Ambulatory Visit

## 2012-10-21 ENCOUNTER — Other Ambulatory Visit: Payer: BC Managed Care – PPO

## 2013-05-04 IMAGING — US US SOFT TISSUE HEAD/NECK
1 series · 14 of 25 positions shown · non-contrast
Comparison: None.

CLINICAL DATA: Enlarged left upper thyroid

THYROID ULTRASOUND
TECHNIQUE: Ultrasound examination of the thyroid gland and adjacent
soft tissues was performed.

[Series 1: us soft tissue head/neck · 0.08mm/px · 14 of 49 slices shown]
[im 1/49]
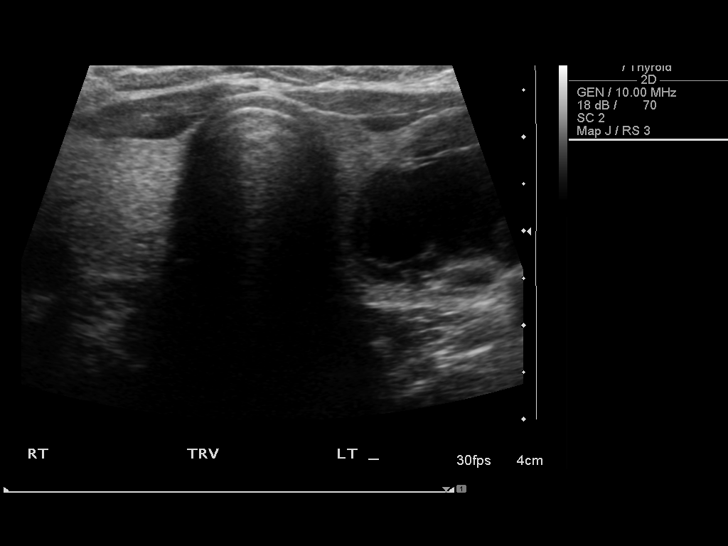
[im 5/49]
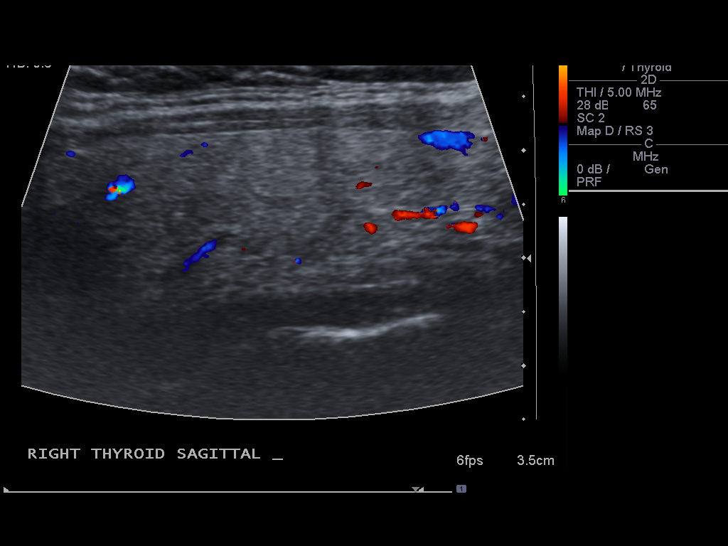
[im 9/49]
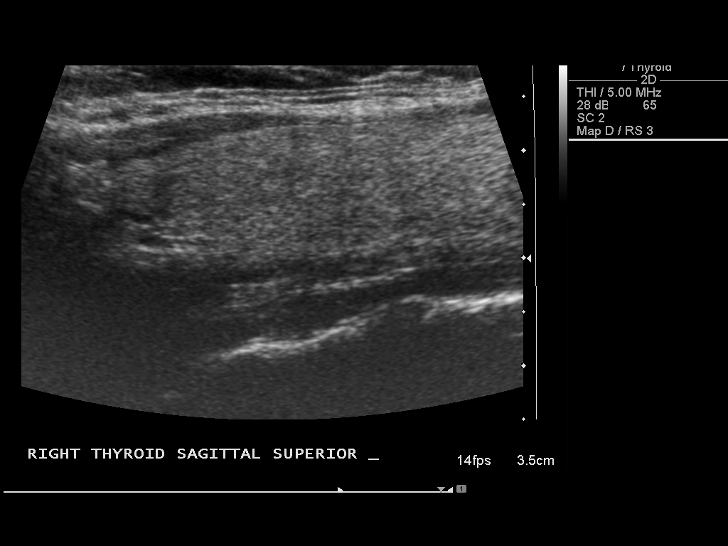
[im 13/49]
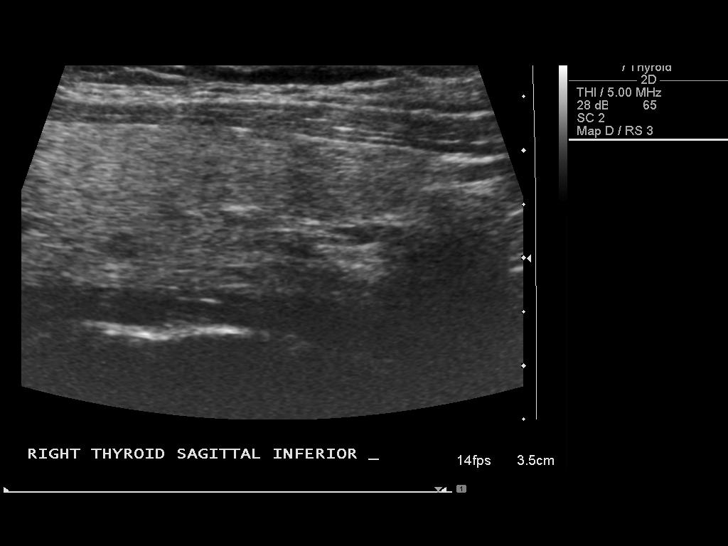
[im 17/49]
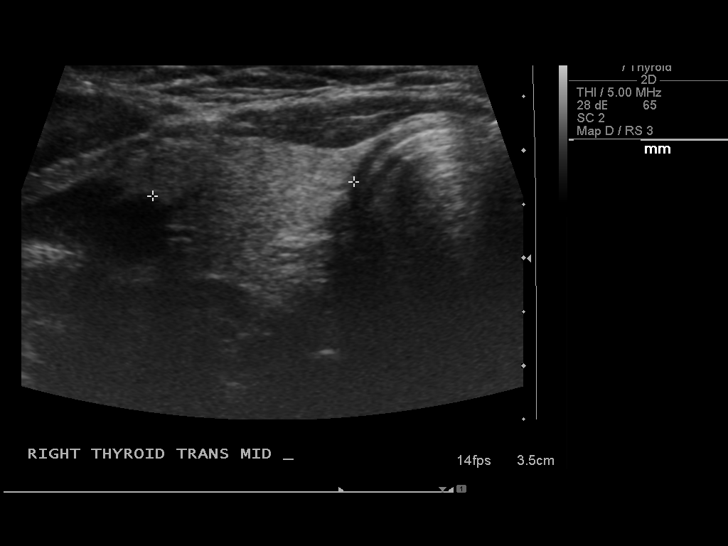
[im 19/49]
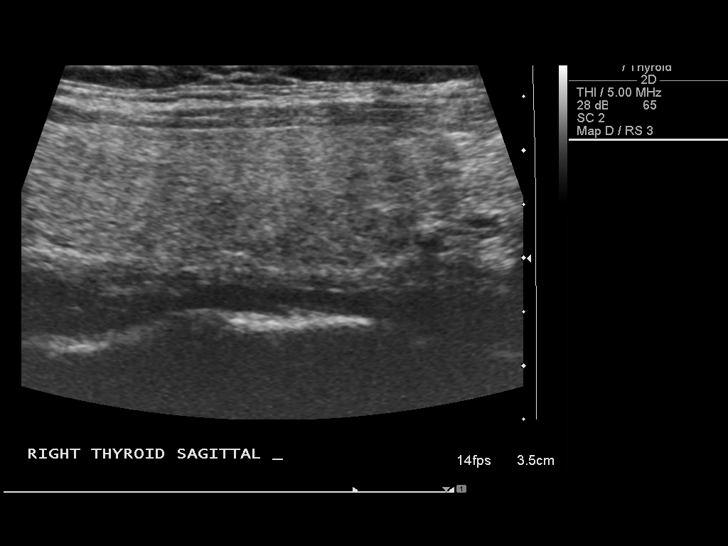
[im 23/49]
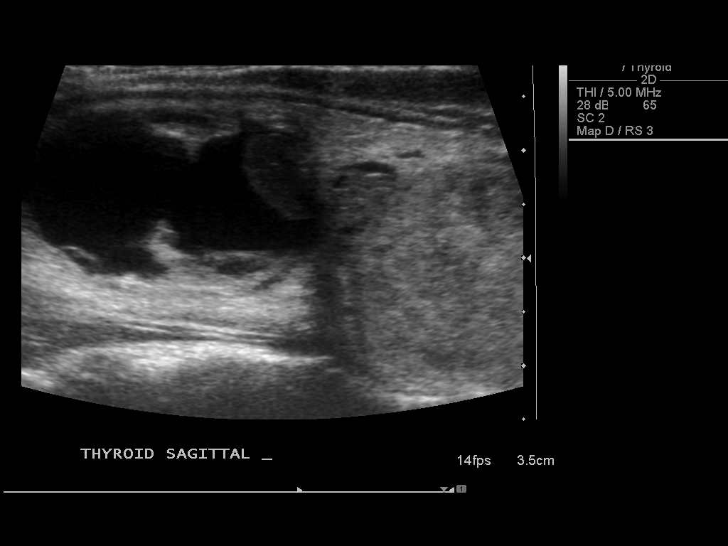
[im 27/49]
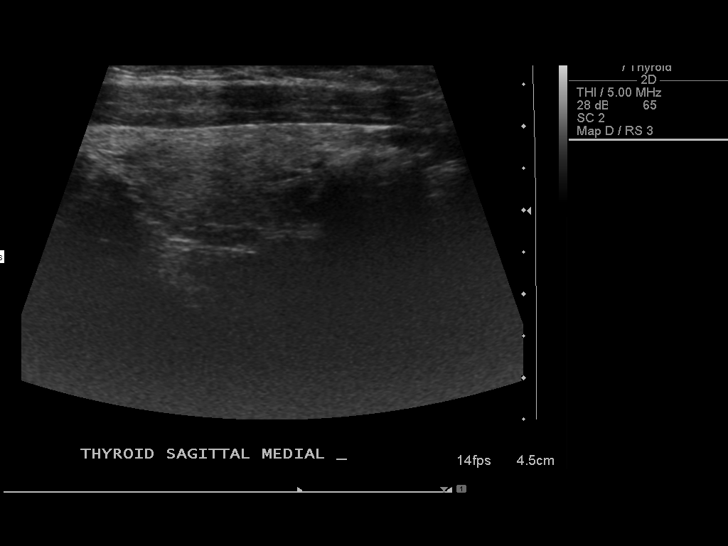
[im 31/49]
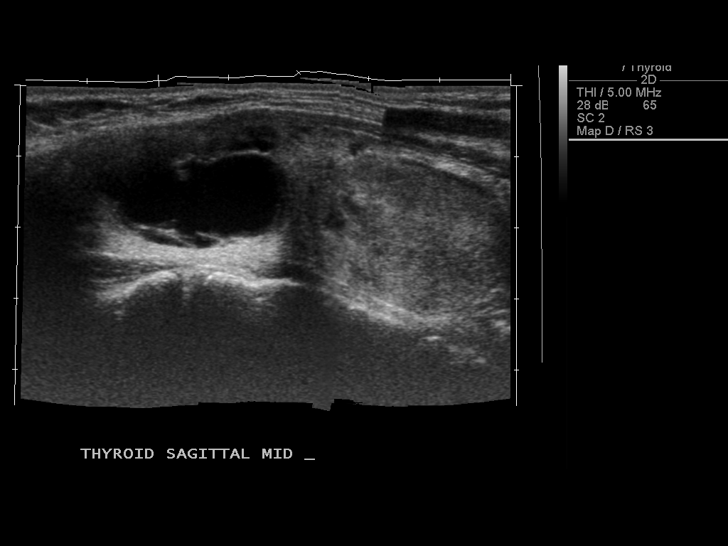
[im 33/49]
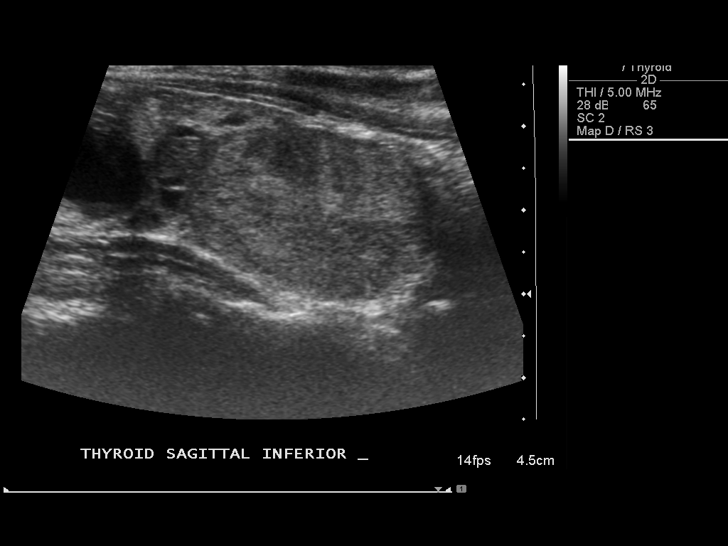
[im 37/49]
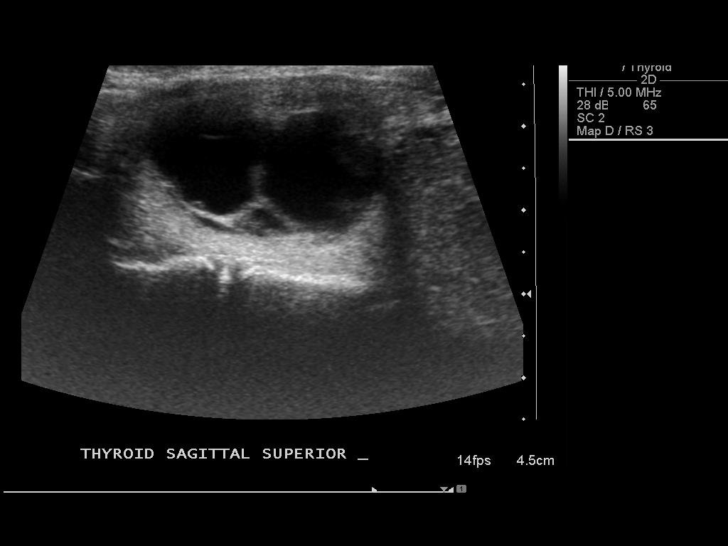
[im 41/49]
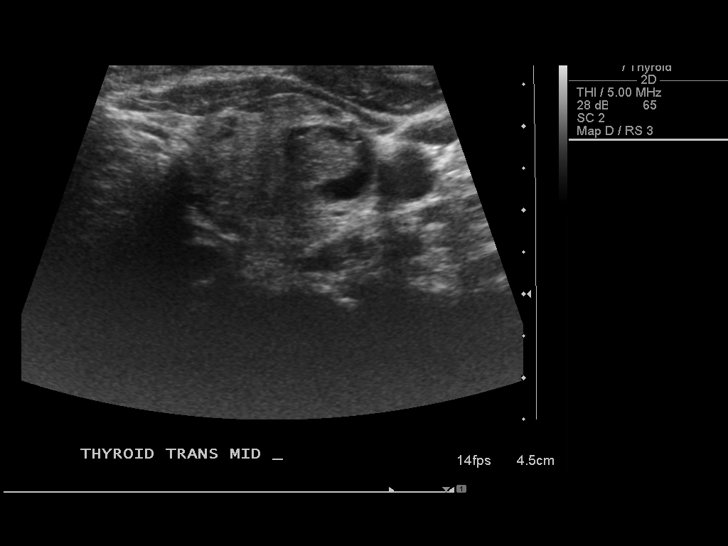
[im 45/49]
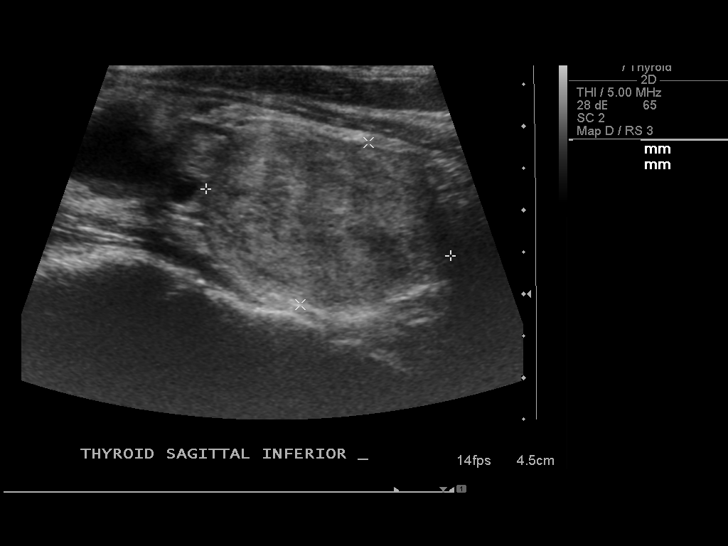
[im 49/49]
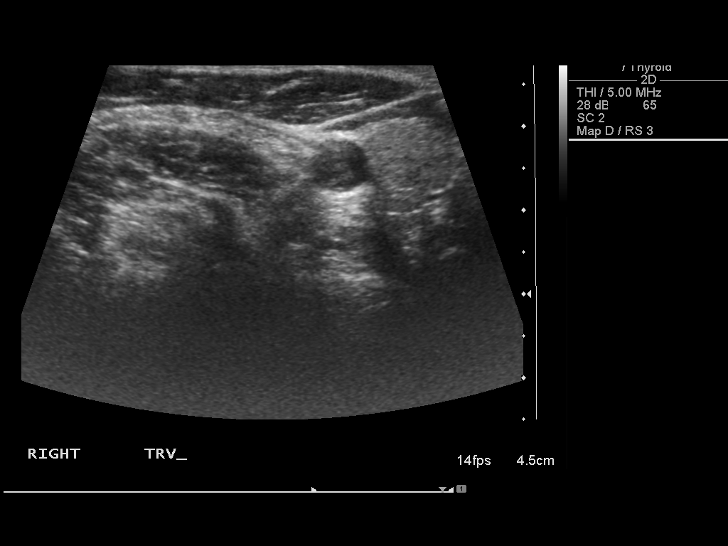

[14 of 25 positions shown; findings below may reference images not displayed]

FINDINGS: Right thyroid lobe:  Enlarged, measuring 5.7 x 1.2 x 1.9 cm.
Left thyroid lobe:  Enlarged, measuring 6.5 x 2.1 x 2.6 cm.
Isthmus:  Measures 1 mm in thickness.

Focal nodules:  2.9 x 1.9 x 2.2 cm predominately cystic nodule in
the left upper gland.  3.0 x 2.1 x 2.2 cm solid nodule in the left
lower gland.

Lymphadenopathy:  None visualized.
IMPRESSION: 3.0 cm solid nodule in the left lower gland.  Percutaneous sampling
should be considered.

This recommendation follows the consensus statement:  Management of
Thyroid Nodules Detected as US:  Society of [employer]ologists in
800.

## 2013-09-24 ENCOUNTER — Other Ambulatory Visit (HOSPITAL_COMMUNITY): Payer: Self-pay | Admitting: Endocrinology

## 2013-09-25 ENCOUNTER — Other Ambulatory Visit (HOSPITAL_COMMUNITY): Payer: Self-pay | Admitting: Endocrinology

## 2013-09-25 DIAGNOSIS — E0590 Thyrotoxicosis, unspecified without thyrotoxic crisis or storm: Secondary | ICD-10-CM

## 2013-09-25 DIAGNOSIS — E041 Nontoxic single thyroid nodule: Secondary | ICD-10-CM

## 2013-09-29 ENCOUNTER — Ambulatory Visit (HOSPITAL_COMMUNITY)
Admission: RE | Admit: 2013-09-29 | Discharge: 2013-09-29 | Disposition: A | Discharge status: Home / Self Care | Payer: BC Managed Care – PPO | Source: Ambulatory Visit | Attending: Endocrinology | Admitting: Endocrinology

## 2013-09-29 DIAGNOSIS — E041 Nontoxic single thyroid nodule: Secondary | ICD-10-CM

## 2013-09-29 DIAGNOSIS — E0590 Thyrotoxicosis, unspecified without thyrotoxic crisis or storm: Secondary | ICD-10-CM

## 2013-10-01 ENCOUNTER — Other Ambulatory Visit (HOSPITAL_COMMUNITY): Payer: Self-pay | Admitting: Endocrinology

## 2013-10-01 DIAGNOSIS — E0510 Thyrotoxicosis with toxic single thyroid nodule without thyrotoxic crisis or storm: Secondary | ICD-10-CM

## 2013-10-09 ENCOUNTER — Encounter (HOSPITAL_COMMUNITY)
Admission: RE | Admit: 2013-10-09 | Discharge: 2013-10-09 | Disposition: A | Discharge status: Home / Self Care | Payer: BC Managed Care – PPO | Source: Ambulatory Visit | Attending: Endocrinology | Admitting: Endocrinology

## 2013-10-09 DIAGNOSIS — E0510 Thyrotoxicosis with toxic single thyroid nodule without thyrotoxic crisis or storm: Secondary | ICD-10-CM

## 2013-10-09 DIAGNOSIS — E042 Nontoxic multinodular goiter: Secondary | ICD-10-CM | POA: Insufficient documentation

## 2013-10-09 MED ORDER — SODIUM PERTECHNETATE TC 99M INJECTION
10.4000 | Freq: Once | INTRAVENOUS | Status: AC | PRN
  Administered 2013-10-09: 10 via INTRAVENOUS

## 2013-10-10 ENCOUNTER — Encounter (HOSPITAL_COMMUNITY): Payer: BC Managed Care – PPO

## 2013-10-14 ENCOUNTER — Other Ambulatory Visit: Payer: Self-pay | Admitting: Endocrinology

## 2013-10-14 DIAGNOSIS — E041 Nontoxic single thyroid nodule: Secondary | ICD-10-CM

## 2013-10-15 ENCOUNTER — Ambulatory Visit
Admission: RE | Admit: 2013-10-15 | Discharge: 2013-10-15 | Disposition: A | Discharge status: Home / Self Care | Payer: BC Managed Care – PPO | Source: Ambulatory Visit | Attending: Endocrinology | Admitting: Endocrinology

## 2013-10-15 ENCOUNTER — Other Ambulatory Visit: Payer: Self-pay | Admitting: Endocrinology

## 2013-10-15 ENCOUNTER — Other Ambulatory Visit (HOSPITAL_COMMUNITY)
Admission: RE | Admit: 2013-10-15 | Discharge: 2013-10-15 | Disposition: A | Discharge status: Home / Self Care | Payer: BC Managed Care – PPO | Source: Ambulatory Visit | Attending: Interventional Radiology | Admitting: Interventional Radiology

## 2013-10-15 DIAGNOSIS — E041 Nontoxic single thyroid nodule: Secondary | ICD-10-CM

## 2013-11-20 ENCOUNTER — Encounter (HOSPITAL_COMMUNITY): Payer: Self-pay | Admitting: Pharmacy Technician

## 2013-11-21 ENCOUNTER — Encounter (HOSPITAL_COMMUNITY)
Admission: RE | Admit: 2013-11-21 | Discharge: 2013-11-21 | Disposition: A | Discharge status: Home / Self Care | Payer: BC Managed Care – PPO | Source: Ambulatory Visit | Attending: Otolaryngology | Admitting: Otolaryngology

## 2013-11-21 ENCOUNTER — Ambulatory Visit (HOSPITAL_COMMUNITY)
Admission: RE | Admit: 2013-11-21 | Discharge: 2013-11-21 | Disposition: A | Discharge status: Home / Self Care | Payer: BC Managed Care – PPO | Source: Ambulatory Visit | Attending: Anesthesiology | Admitting: Anesthesiology

## 2013-11-21 ENCOUNTER — Encounter (HOSPITAL_COMMUNITY): Payer: Self-pay

## 2013-11-21 DIAGNOSIS — Z01818 Encounter for other preprocedural examination: Secondary | ICD-10-CM | POA: Insufficient documentation

## 2013-11-21 DIAGNOSIS — E079 Disorder of thyroid, unspecified: Secondary | ICD-10-CM | POA: Insufficient documentation

## 2013-11-21 HISTORY — DX: Anxiety disorder, unspecified: F41.9

## 2013-11-21 LAB — CBC
HEMATOCRIT: 38.9 % (ref 36.0–46.0)
HEMOGLOBIN: 13.5 g/dL (ref 12.0–15.0)
MCH: 32.6 pg (ref 26.0–34.0)
MCHC: 34.7 g/dL (ref 30.0–36.0)
MCV: 94 fL (ref 78.0–100.0)
Platelets: 332 10*3/uL (ref 150–400)
RBC: 4.14 MIL/uL (ref 3.87–5.11)
RDW: 11.9 % (ref 11.5–15.5)
WBC: 6.4 10*3/uL (ref 4.0–10.5)

## 2013-11-21 LAB — BASIC METABOLIC PANEL
BUN: 8 mg/dL (ref 6–23)
CO2: 27 mEq/L (ref 19–32)
CREATININE: 0.75 mg/dL (ref 0.50–1.10)
Calcium: 9.2 mg/dL (ref 8.4–10.5)
Chloride: 102 mEq/L (ref 96–112)
GFR calc Af Amer: >90 mL/min (ref >90)
GFR calc non Af Amer: >90 mL/min (ref >90)
GLUCOSE: 102 mg/dL — AB (ref 70–99)
POTASSIUM: 4.1 meq/L (ref 3.7–5.3)
Sodium: 142 mEq/L (ref 137–147)

## 2013-11-21 LAB — HCG, SERUM, QUALITATIVE: PREG SERUM: NEGATIVE

## 2013-11-21 NOTE — H&P (Signed)
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 Discussed  "Cold" 3.7 cm  nodule on the left thyroid lobe. Cytology reveals follicular cells with atypia. We discussed that there is approximately a 20-30% chance this is cancerous and otherwise benign. We discussed lthe need for left thyroid lobectomy with frozen section and possible total thyroidectomy. We discussed the chance of requiring a second surgery in the future to remove the second side if the frozen section is non-diagnostic. We discussed the riss of thyroid surgery including recurrent nerve paralysis and hypocalcemia. She may choose to have the entire gland removed despite the frozen section to assure that she will never need another surgery. Risks and benefits ewre discussed in detail. All questions were answered. Reason For Visit  Stephanie Thompson is here today at the kind request of Balan, Bindubal for consultation and opinion for thyroid. HPI  Left thyroid nodule found on ultrasound, "Cold" on thyroid scan, clinically euthyroid. FNA revealed follicular cells with atypia. No symptoms related to this. Allergies  No Known Drug Allergies. Current Meds  No Reported Medications;; RPT. PSH  Gynecologic Surgery Oral Surgery Tooth Extraction. Family Hx  Family history of malignant neoplasm of prostate: Father 45(V16.42) 61(Z80.42) H/O thyroidectomy: Mother (Z98.89). Personal Hx  Caffeine use (V49.89) (Z78.9); 2 cups day Never smoker No alcohol use Non-smoker (V49.89) (Z78.9). ROS  Systemic: Not feeling tired (fatigue).  No fever  and no night sweats.  Recent weight loss. Head: No headache. Eyes: No eye symptoms. Otolaryngeal: No hearing loss, no earache, no tinnitus, and no purulent nasal discharge.  No nasal passage blockage (stuffiness), no snoring, no sneezing, no hoarseness, and no sore throat. Cardiovascular: No chest pain or discomfort  and no palpitations. Pulmonary: No dyspnea, no cough, and no wheezing. Gastrointestinal: No  dysphagia  and no heartburn.  No nausea, no abdominal pain, and no melena.  No diarrhea. Genitourinary: No dysuria. Endocrine: No muscle weakness. Musculoskeletal: No calf muscle cramps, no arthralgias, and no soft tissue swelling. Neurological: No dizziness, no fainting, no tingling, and no numbness. Psychological: No anxiety  and no depression. Skin: No rash. 12 system ROS was obtained and reviewed on the Health Maintenance form dated today.  Positive responses are shown above.  If the symptom is not checked, the patient has denied it. Vital Signs   Recorded by [facility]Sharon on 31 Oct 2013 01:31 PM Height: 64.5 in, Weight: 137 lb 2 oz, BMI: 23.2 kg/m2,  BMI Calculated: 23.17 ,  BSA Calculated: 1.68 ,  BP:100/60. Physical Exam  APPEARANCE: Well developed, well nourished, in no acute distress.  Normal affect, in a pleasant mood.  Oriented to time, place and person. COMMUNICATION: Normal voice   HEAD & FACE:  No scars, lesions or masses of head and face.  Sinuses nontender to palpation.  Salivary glands without mass or tenderness.  Facial strength symmetric.  No facial lesion, scars, or mass. EYES: EOMI with normal primary gaze alignment. Visual acuity grossly intact.  PERRLA EXTERNAL EAR & NOSE: No scars, lesions or masses  EAC & TYMPANIC MEMBRANE:  EAC shows no obstructing lesions or debris and tympanic membranes are normal bilaterally with good movement to insufflation. GROSS HEARING: Normal  TMJ:  Nontender  INTRANASAL EXAM: No polyps or purulence.  NASOPHARYNX: Normal, without lesions. LIPS, TEETH & GUMS: No lip lesions, normal dentition and normal gums. ORAL CAVITY/OROPHARYNX:  Oral mucosa moist without lesion or asymmetry of the palate, tongue, tonsil or posterior pharynx. LARYNX (mirror exam):  No lesions of the epiglottis, false cord  or TVC's and cords move well to phonation. HYPOPHARYNX (mirror exam): No lesions, asymmetry or pooling of secretions. NECK:  Supple without  adenopathy or mass. THYROID:  Palpable nodule on the left side.  NEUROLOGIC:  No gross CN deficits. No nystagmus noted.   LYMPHATIC:  No enlarged nodes palpable. Procedure  Fiberoptic Laryngoscopy Name: Stephanie Thompson     Age: [age] year   The risks and benefits of this procedure have been thoroughly discussed with the patient.  The most commons risks outlined included but were not limited to: injury  to the nasal mucosa or throat irritation.  The patient was further informed that there are other less common risks.  The patient was given the opportunity to ask questions and all such questions were answered to the patient's satisfaction.  Patient acknowledged the risks and has agreed to proceed.   Surgeon: Serena ColonelJefry Rosen After adequate decongestion with topical Neosynephrine and anesthesia with topical Xylocaine, the nasopharyngoscope was inserted through the Right nasal cavity. The contents of the nasal cavity are normal. The nasopharynx is without masses and the eustachian tubes were clear. The pharyngeal walls are non-erythematous and without lesions. The pyriform sinuses are open without pooling of secretions. The post cricoid region is normal. The arytenoids are normal. The false vocal folds are normal. The epiglottis is non-erythematous and without lesions. The true vocal folds are straight and mobile.   Tolerance: Excellent Unplanned interventions: None  Unplanned events: No complications  Amended : Serena ColonelJefry  Rosen  M.D.; 10/31/2013 9:04 PM EST. Signature  Electronically signed by : Serena ColonelJefry  Rosen  M.D.; 10/31/2013 7:50 PM EST. Electronically signed by : Serena ColonelJefry  Rosen  M.D.; 10/31/2013 9:05 PM EST.

## 2013-11-21 NOTE — Pre-Procedure Instructions (Signed)
Stephanie Thompson  11/21/2013   Your procedure is scheduled on: Friday, November 28, 2013  [facility] to [facility] (use Main Entrance "A'') at 5:30 AM.  Call this number if you have problems the morning of surgery: [phone]   Remember:   Do not eat food or drink liquids after midnight Thursday, November 27, 2013    Take these medicines the morning of surgery with A SIP OF WATER: None Stop taking Aspirin, vitamins and herbal medications. Do not take any NSAIDs ie: Ibuprofen, Advil, Naproxen or any medication containing Aspirin. Stop 5 days prior to procedure Stephanie Thompson( Sun. 11/23/13).   Do not wear jewelry, make-up or nail polish.  Do not wear lotions, powders, or perfumes. You may wear deodorant.  Do not shave 48 hours prior to surgery.   Do not bring valuables to the hospital.  [facility] responsible for any belongings or valuables.               Contacts, dentures or bridgework may not be worn into surgery.  Leave suitcase in the car. After surgery it may be brought to your room.  For patients admitted to the hospital, discharge time is determined by your treatment team.               Patients discharged the day of surgery will not be allowed to drive home.  Name and phone number of your driver:   Special Instructions:  Special Instructions:Special Instructions: [facility] - Pre[facility]  Before surgery, you can play an important role.  Because skin is not sterile, your skin needs to be as free of germs as possible.  You can reduce the number of germs on you skin by washing with CHG (chlorahexidine gluconate) soap before surgery.  CHG is an antiseptic cleaner which kills germs and bonds with the skin to continue killing germs even after washing.  Please DO NOT use if you have an allergy to CHG or antibacterial soaps.  If your skin becomes reddened/irritated stop using the CHG and inform your nurse when you arrive at [facility].  Do not shave (including legs and  underarms) for at least 48 hours prior to the first CHG shower.  You may shave your face.  Please follow these instructions carefully:   1.  Shower with CHG Soap the night before surgery and the morning of Surgery.  2.  If you choose to wash your hair, wash your hair first as usual with your normal shampoo.  3.  After you shampoo, rinse your hair and body thoroughly to remove the Shampoo.  4.  Use CHG as you would any other liquid soap.  You can apply chg directly  to the skin and wash gently with scrungie or a clean washcloth.  5.  Apply the CHG Soap to your body ONLY FROM THE NECK DOWN.  Do not use on open wounds or open sores.  Avoid contact with your eyes, ears, mouth and genitals (private parts).  Wash genitals (private parts) with your normal soap.  6.  Wash thoroughly, paying special attention to the area where your surgery will be performed.  7.  Thoroughly rinse your body with warm water from the neck down.  8.  DO NOT shower/wash with your normal soap after using and rinsing off the CHG Soap.  9.  Pat yourself dry with a clean towel.            10.  Wear clean pajamas.  11.  Place clean sheets on your bed the night of your first shower and do not sleep with pets.  Day of Surgery  Do not apply any lotions the morning of surgery.  Please wear clean clothes to the hospital/surgery [facility].   Please read over the following fact sheets that you were given: Pain Booklet and Surgical Site Infection Prevention

## 2013-11-27 MED ORDER — CEFAZOLIN SODIUM-DEXTROSE 2-3 GM-% IV SOLR
2.0000 g | INTRAVENOUS | Status: AC
  Administered 2013-11-28: 2 g via INTRAVENOUS
  Filled 2013-11-27: qty 50

## 2013-11-28 ENCOUNTER — Encounter (HOSPITAL_COMMUNITY)
Admission: RE | Disposition: A | Discharge status: Home / Self Care | Payer: Self-pay | Source: Ambulatory Visit | Attending: Otolaryngology

## 2013-11-28 ENCOUNTER — Encounter (HOSPITAL_COMMUNITY): Payer: BC Managed Care – PPO | Admitting: Anesthesiology

## 2013-11-28 ENCOUNTER — Ambulatory Visit (HOSPITAL_COMMUNITY): Payer: BC Managed Care – PPO | Admitting: Anesthesiology

## 2013-11-28 ENCOUNTER — Inpatient Hospital Stay (HOSPITAL_COMMUNITY)
Admission: RE | Admit: 2013-11-28 | Discharge: 2013-11-30 | DRG: 627 | Disposition: A | Discharge status: Home / Self Care | Payer: BC Managed Care – PPO | Source: Ambulatory Visit | Attending: Otolaryngology | Admitting: Otolaryngology

## 2013-11-28 ENCOUNTER — Encounter (HOSPITAL_COMMUNITY): Payer: Self-pay | Admitting: *Deleted

## 2013-11-28 DIAGNOSIS — E890 Postprocedural hypothyroidism: Secondary | ICD-10-CM

## 2013-11-28 DIAGNOSIS — E041 Nontoxic single thyroid nodule: Principal | ICD-10-CM | POA: Diagnosis present

## 2013-11-28 DIAGNOSIS — Z9889 Other specified postprocedural states: Secondary | ICD-10-CM

## 2013-11-28 HISTORY — PX: THYROIDECTOMY: SHX17

## 2013-11-28 LAB — CALCIUM
CALCIUM: 7.8 mg/dL — AB (ref 8.4–10.5)
Calcium: 8.3 mg/dL — ABNORMAL LOW (ref 8.4–10.5)

## 2013-11-28 SURGERY — THYROIDECTOMY
Anesthesia: General | Site: Neck | Laterality: Bilateral

## 2013-11-28 MED ORDER — ROCURONIUM BROMIDE 100 MG/10ML IV SOLN
INTRAVENOUS | Status: DC | PRN
  Administered 2013-11-28: 40 mg via INTRAVENOUS

## 2013-11-28 MED ORDER — HYDROMORPHONE HCL PF 1 MG/ML IJ SOLN
0.2500 mg | INTRAMUSCULAR | Status: DC | PRN
  Administered 2013-11-28 (×2): 0.25 mg via INTRAVENOUS

## 2013-11-28 MED ORDER — FENTANYL CITRATE 0.05 MG/ML IJ SOLN
INTRAMUSCULAR | Status: AC
  Filled 2013-11-28: qty 5

## 2013-11-28 MED ORDER — HYDROCODONE-ACETAMINOPHEN 5-325 MG PO TABS
1.0000 | ORAL_TABLET | ORAL | Status: DC | PRN
  Administered 2013-11-28: 2 via ORAL
  Filled 2013-11-28: qty 2

## 2013-11-28 MED ORDER — IBUPROFEN 100 MG/5ML PO SUSP
400.0000 mg | Freq: Four times a day (QID) | ORAL | Status: DC | PRN
  Filled 2013-11-28: qty 20

## 2013-11-28 MED ORDER — LEVOTHYROXINE SODIUM 100 MCG PO TABS
100.0000 ug | ORAL_TABLET | Freq: Every day | ORAL | Status: DC
  Administered 2013-11-29 – 2013-11-30 (×2): 100 ug via ORAL
  Filled 2013-11-28 (×3): qty 1

## 2013-11-28 MED ORDER — MIDAZOLAM HCL 5 MG/5ML IJ SOLN
INTRAMUSCULAR | Status: DC | PRN
  Administered 2013-11-28: 2 mg via INTRAVENOUS

## 2013-11-28 MED ORDER — HYDROMORPHONE HCL PF 1 MG/ML IJ SOLN
INTRAMUSCULAR | Status: AC
  Filled 2013-11-28: qty 1

## 2013-11-28 MED ORDER — NEOSTIGMINE METHYLSULFATE 10 MG/10ML IV SOLN
INTRAVENOUS | Status: DC | PRN
  Administered 2013-11-28: 3 mg via INTRAVENOUS

## 2013-11-28 MED ORDER — ROCURONIUM BROMIDE 50 MG/5ML IV SOLN
INTRAVENOUS | Status: AC
  Filled 2013-11-28: qty 1

## 2013-11-28 MED ORDER — EPHEDRINE SULFATE 50 MG/ML IJ SOLN
INTRAMUSCULAR | Status: AC
  Filled 2013-11-28: qty 1

## 2013-11-28 MED ORDER — PROMETHAZINE HCL 25 MG PO TABS
25.0000 mg | ORAL_TABLET | Freq: Four times a day (QID) | ORAL | Status: DC | PRN
  Administered 2013-11-28: 25 mg via ORAL
  Filled 2013-11-28: qty 1

## 2013-11-28 MED ORDER — LIDOCAINE HCL (CARDIAC) 20 MG/ML IV SOLN
INTRAVENOUS | Status: AC
  Filled 2013-11-28: qty 5

## 2013-11-28 MED ORDER — NEOSTIGMINE METHYLSULFATE 10 MG/10ML IV SOLN
INTRAVENOUS | Status: AC
  Filled 2013-11-28: qty 1

## 2013-11-28 MED ORDER — MEPERIDINE HCL 25 MG/ML IJ SOLN: 6.2500 mg | INTRAMUSCULAR | Status: DC | PRN

## 2013-11-28 MED ORDER — PROPOFOL 10 MG/ML IV BOLUS
INTRAVENOUS | Status: DC | PRN
  Administered 2013-11-28: 200 mg via INTRAVENOUS

## 2013-11-28 MED ORDER — OXYCODONE HCL 5 MG/5ML PO SOLN: 5.0000 mg | Freq: Once | ORAL | Status: DC | PRN

## 2013-11-28 MED ORDER — FENTANYL CITRATE 0.05 MG/ML IJ SOLN
INTRAMUSCULAR | Status: DC | PRN
  Administered 2013-11-28: 50 ug via INTRAVENOUS
  Administered 2013-11-28: 100 ug via INTRAVENOUS
  Administered 2013-11-28: 50 ug via INTRAVENOUS
  Administered 2013-11-28: 100 ug via INTRAVENOUS

## 2013-11-28 MED ORDER — LACTATED RINGERS IV SOLN
INTRAVENOUS | Status: DC | PRN
  Administered 2013-11-28: 07:00:00 via INTRAVENOUS

## 2013-11-28 MED ORDER — MIDAZOLAM HCL 2 MG/2ML IJ SOLN
INTRAMUSCULAR | Status: AC
  Filled 2013-11-28: qty 2

## 2013-11-28 MED ORDER — GLYCOPYRROLATE 0.2 MG/ML IJ SOLN
INTRAMUSCULAR | Status: DC | PRN
  Administered 2013-11-28: 0.4 mg via INTRAVENOUS

## 2013-11-28 MED ORDER — PROMETHAZINE HCL 25 MG RE SUPP: 25.0000 mg | Freq: Four times a day (QID) | RECTAL | Status: DC | PRN

## 2013-11-28 MED ORDER — OXYCODONE HCL 5 MG PO TABS: 5.0000 mg | ORAL_TABLET | Freq: Once | ORAL | Status: DC | PRN

## 2013-11-28 MED ORDER — DEXTROSE-NACL 5-0.9 % IV SOLN
INTRAVENOUS | Status: DC
  Administered 2013-11-28: 22:00:00 via INTRAVENOUS

## 2013-11-28 MED ORDER — LIDOCAINE HCL (CARDIAC) 20 MG/ML IV SOLN
INTRAVENOUS | Status: DC | PRN
  Administered 2013-11-28: 40 mg via INTRAVENOUS

## 2013-11-28 MED ORDER — SODIUM CHLORIDE 0.9 % IJ SOLN
INTRAMUSCULAR | Status: AC
  Filled 2013-11-28: qty 10

## 2013-11-28 MED ORDER — DEXAMETHASONE SODIUM PHOSPHATE 4 MG/ML IJ SOLN
INTRAMUSCULAR | Status: AC
  Filled 2013-11-28: qty 1

## 2013-11-28 MED ORDER — DEXAMETHASONE SODIUM PHOSPHATE 4 MG/ML IJ SOLN
INTRAMUSCULAR | Status: DC | PRN
  Administered 2013-11-28: 4 mg via INTRAVENOUS

## 2013-11-28 MED ORDER — HYDROCODONE-ACETAMINOPHEN 7.5-325 MG PO TABS: 1.0000 | ORAL_TABLET | Freq: Four times a day (QID) | ORAL | Status: DC | PRN

## 2013-11-28 MED ORDER — BACITRACIN ZINC 500 UNIT/GM EX OINT
TOPICAL_OINTMENT | CUTANEOUS | Status: AC
  Filled 2013-11-28: qty 15

## 2013-11-28 MED ORDER — ONDANSETRON HCL 4 MG/2ML IJ SOLN
INTRAMUSCULAR | Status: DC | PRN
  Administered 2013-11-28: 4 mg via INTRAVENOUS

## 2013-11-28 MED ORDER — ONDANSETRON HCL 4 MG/2ML IJ SOLN
INTRAMUSCULAR | Status: AC
  Filled 2013-11-28: qty 2

## 2013-11-28 MED ORDER — LIDOCAINE HCL 4 % MT SOLN
OROMUCOSAL | Status: DC | PRN
  Administered 2013-11-28: 4 mL via TOPICAL

## 2013-11-28 MED ORDER — SUCCINYLCHOLINE CHLORIDE 20 MG/ML IJ SOLN
INTRAMUSCULAR | Status: AC
  Filled 2013-11-28: qty 1

## 2013-11-28 MED ORDER — LIDOCAINE-EPINEPHRINE 1 %-1:100000 IJ SOLN
INTRAMUSCULAR | Status: AC
  Filled 2013-11-28: qty 1

## 2013-11-28 MED ORDER — GLYCOPYRROLATE 0.2 MG/ML IJ SOLN
INTRAMUSCULAR | Status: AC
  Filled 2013-11-28: qty 2

## 2013-11-28 MED ORDER — SYNTHROID 100 MCG PO TABS: 100.0000 ug | ORAL_TABLET | Freq: Every day | ORAL | Status: AC

## 2013-11-28 MED ORDER — PROMETHAZINE HCL 25 MG/ML IJ SOLN: 6.2500 mg | INTRAMUSCULAR | Status: DC | PRN

## 2013-11-28 MED ORDER — PROPOFOL 10 MG/ML IV BOLUS
INTRAVENOUS | Status: AC
  Filled 2013-11-28: qty 20

## 2013-11-28 MED ORDER — MIDAZOLAM HCL 2 MG/2ML IJ SOLN: 0.5000 mg | Freq: Once | INTRAMUSCULAR | Status: DC | PRN

## 2013-11-28 MED ORDER — 0.9 % SODIUM CHLORIDE (POUR BTL) OPTIME
TOPICAL | Status: DC | PRN
  Administered 2013-11-28: 1000 mL

## 2013-11-28 SURGICAL SUPPLY — 56 items
ADH SKN CLS APL DERMABOND .7 (GAUZE/BANDAGES/DRESSINGS) ×1
APPLIER CLIP 9.375 SM OPEN (CLIP)
APR CLP SM 9.3 20 MLT OPN (CLIP)
ATTRACTOMAT 16X20 MAGNETIC DRP (DRAPES) IMPLANT
BLADE SURG 10 STRL SS (BLADE) ×2 IMPLANT
BLADE SURG 15 STRL LF DISP TIS (BLADE) IMPLANT
BLADE SURG 15 STRL SS (BLADE) ×3
CANISTER SUCTION 2500CC (MISCELLANEOUS) ×3 IMPLANT
CLEANER TIP ELECTROSURG 2X2 (MISCELLANEOUS) ×3 IMPLANT
CLIP APPLIE 9.375 SM OPEN (CLIP) IMPLANT
CONT SPEC 4OZ CLIKSEAL STRL BL (MISCELLANEOUS) ×2 IMPLANT
CORDS BIPOLAR (ELECTRODE) ×3 IMPLANT
COVER SURGICAL LIGHT HANDLE (MISCELLANEOUS) ×3 IMPLANT
DERMABOND ADVANCED (GAUZE/BANDAGES/DRESSINGS) ×2
DERMABOND ADVANCED .7 DNX12 (GAUZE/BANDAGES/DRESSINGS) ×1 IMPLANT
DRAIN HEMOVAC 7FR (DRAIN) IMPLANT
DRAIN SNY 10 ROU (WOUND CARE) ×2 IMPLANT
ELECT COATED BLADE 2.86 ST (ELECTRODE) ×5 IMPLANT
ELECT REM PT RETURN 9FT ADLT (ELECTROSURGICAL) ×3
ELECTRODE REM PT RTRN 9FT ADLT (ELECTROSURGICAL) ×1 IMPLANT
EVACUATOR SILICONE 100CC (DRAIN) ×3 IMPLANT
FORCEPS BIPOLAR SPETZLER 8 1.0 (NEUROSURGERY SUPPLIES) ×3 IMPLANT
GAUZE SPONGE 4X4 12PLY STRL (GAUZE/BANDAGES/DRESSINGS) ×2 IMPLANT
GAUZE SPONGE 4X4 16PLY XRAY LF (GAUZE/BANDAGES/DRESSINGS) ×4 IMPLANT
GLOVE BIO SURGEON STRL SZ 6.5 (GLOVE) IMPLANT
GLOVE BIO SURGEONS STRL SZ 6.5 (GLOVE)
GLOVE BIOGEL PI IND STRL 6.5 (GLOVE) IMPLANT
GLOVE BIOGEL PI IND STRL 7.0 (GLOVE) IMPLANT
GLOVE BIOGEL PI INDICATOR 6.5 (GLOVE) ×2
GLOVE BIOGEL PI INDICATOR 7.0 (GLOVE) ×4
GLOVE ECLIPSE 7.5 STRL STRAW (GLOVE) ×3 IMPLANT
GLOVE SURG SS PI 6.5 STRL IVOR (GLOVE) ×2 IMPLANT
GOWN STRL REUS W/ TWL LRG LVL3 (GOWN DISPOSABLE) ×2 IMPLANT
GOWN STRL REUS W/ TWL XL LVL3 (GOWN DISPOSABLE) IMPLANT
GOWN STRL REUS W/TWL LRG LVL3 (GOWN DISPOSABLE) ×9
GOWN STRL REUS W/TWL XL LVL3 (GOWN DISPOSABLE) ×3
KIT BASIN OR (CUSTOM PROCEDURE TRAY) ×3 IMPLANT
KIT ROOM TURNOVER OR (KITS) ×3 IMPLANT
NDL HYPO 25GX1X1/2 BEV (NEEDLE) IMPLANT
NEEDLE 27GAX1X1/2 (NEEDLE) IMPLANT
NEEDLE HYPO 25GX1X1/2 BEV (NEEDLE) ×3 IMPLANT
NS IRRIG 1000ML POUR BTL (IV SOLUTION) ×3 IMPLANT
PAD ARMBOARD 7.5X6 YLW CONV (MISCELLANEOUS) ×6 IMPLANT
PAD SHARPS MAGNETIC DISPOSAL (MISCELLANEOUS) ×2 IMPLANT
PENCIL FOOT CONTROL (ELECTRODE) ×3 IMPLANT
SPONGE INTESTINAL PEANUT (DISPOSABLE) IMPLANT
STAPLER VISISTAT 35W (STAPLE) ×3 IMPLANT
SUT CHROMIC 4 0 PS 2 18 (SUTURE) ×10 IMPLANT
SUT ETHILON 3 0 PS 1 (SUTURE) ×3 IMPLANT
SUT SILK 2 0 SH (SUTURE) ×2 IMPLANT
SUT SILK 3 0 REEL (SUTURE) IMPLANT
SUT SILK 4 0 P 3 (SUTURE) IMPLANT
SUT SILK 4 0 REEL (SUTURE) ×7 IMPLANT
TOWEL OR 17X24 6PK STRL BLUE (TOWEL DISPOSABLE) ×3 IMPLANT
TOWEL OR 17X26 10 PK STRL BLUE (TOWEL DISPOSABLE) ×3 IMPLANT
TRAY ENT MC OR (CUSTOM PROCEDURE TRAY) ×3 IMPLANT

## 2013-11-28 NOTE — Anesthesia Postprocedure Evaluation (Signed)
  Anesthesia Post-op Note  Patient: Stephanie CoeJodi F Helbert  Procedure(s) Performed: Procedure(s): TOTAL THYROIDECTOMY  (Bilateral)  Patient Location: PACU  Anesthesia Type:General  Level of Consciousness: awake, alert , oriented and patient cooperative  Airway and Oxygen Therapy: Patient Spontanous Breathing  Post-op Pain: none  Post-op Assessment: Post-op Vital signs reviewed, Patient's Cardiovascular Status Stable, Respiratory Function Stable, Patent Airway, No signs of Nausea or vomiting and Pain level controlled  Post-op Vital Signs: Reviewed and stable  Last Vitals:  Filed Vitals:   11/28/13 1100  BP:   Pulse: 101  Temp: 36.7 C  Resp: 20    Complications: No apparent anesthesia complications

## 2013-11-28 NOTE — Progress Notes (Signed)
Patient ID: Irven CoeJodi F Minassian, female   DOB: 1980/12/06, 33 y.o.   MRN: [id number] Complains of some headache and nausea.  Otherwise, doing well. AFVSS  Drain 45 cc Alert NAD. Normal voice. Incision clean and intact, no fluid collection, drain functioning. Calcium 8.3 -> 7.8 A/P: s/p total thyroidectomy Observe overnight with drain in place.  Calcium check in am.

## 2013-11-28 NOTE — Interval H&P Note (Signed)
History and Physical Interval Note:  11/28/2013 7:27 AM  Stephanie Thompson  has presented today for surgery, with the diagnosis of LEFT THYROID NODULE  The various methods of treatment have been discussed with the patient and family. After consideration of risks, benefits and other options for treatment, the patient has consented to  Procedure(s): LEFT THYROIDECTOMY WITH FROZEN SECTION/POSSIBLE TOTAL (Left) as a surgical intervention .  The patient's history has been reviewed, patient examined, no change in status, stable for surgery.  I have reviewed the patient's chart and labs.  Questions were answered to the patient's satisfaction.  She has elected to proceed with total thyroidectomy.   ROSEN, JEFRY

## 2013-11-28 NOTE — Op Note (Signed)
OPERATIVE REPORT  DATE OF SURGERY: 11/28/2013  PATIENT:  Stephanie Thompson,  33 y.o. female  PRE-OPERATIVE DIAGNOSIS:  LEFT THYROID NODULE  POST-OPERATIVE DIAGNOSIS:  LEFT THYROID NODULE  PROCEDURE:  Procedure(s): TOTAL THYROIDECTOMY   SURGEON:  Susy FrizzleJefry H Rosen, MD  ASSISTANTS: Aquilla HackerLouise Nordbladh PA  ANESTHESIA:   General   EBL:  25 ml  DRAINS: 10 French round J-P   LOCAL MEDICATIONS USED:  None  SPECIMEN:  Total thyroidectomy suture marks left lobe  COUNTS:  Correct  PROCEDURE DETAILS: The patient was taken to the operating room and placed on the operating table in the supine position. A shoulder roll was placed beneath the shoulder blades and the neck was extended. The neck was prepped and draped in a standard fashion. A low collar transverse incision was outlined marking pen and was incised with electrocautery. Dissection was continued down through the platysma layer. Subplatysmal flaps were elevated superiorly to the thyroid cartilage and inferiorly to the clavicle. Self-retaining thyroid retractor was used throughout the case.  The midline fascia was divided. The strap muscles were reflected laterally off both lobes of the thyroid. There were little bit adhesed to the thyroid gland capsule. Side was dissected first followed by the right. The left side contained 2 large nodules the superior one was firm and the inferior one was soft. The right side contain no nodules. The superior vasculature was separately identified ligated between clamps and divided. 4-0 silk ties were used throughout the case. The dissection was continued directly on the capsule of the gland. As the dissection was brought more inferiorly the recurrent nerve was identified and preserved. A suspected inferior parathyroid gland was identified on the left and preserved with its blood supply. On the right side the nerve was preserved as well and parathyroids were not identified. The gland was dissected off the trachea  and sent for pathologic evaluation. The wound was irrigated with saline and hemostasis was completed with additional ties and bipolar cautery as needed. The drain was placed through separate stab incision and secured in place. The midline fascia was reapproximated with chromic suture. The platysma layer was reapproximated with interrupted chromic and a subcuticular closure of running chromic was accomplished. Dermabond was used on the skin. Patient was awakened, extubated and transferred to recovery in stable condition.   PATIENT DISPOSITION:  To PACU, stable

## 2013-11-28 NOTE — Transfer of Care (Signed)
Immediate Anesthesia Transfer of Care Note  Patient: Stephanie Thompson  Procedure(s) Performed: Procedure(s): TOTAL THYROIDECTOMY  (Bilateral)  Patient Location: PACU  Anesthesia Type:General  Level of Consciousness: awake, alert  and oriented  Airway & Oxygen Therapy: Patient Spontanous Breathing and Patient connected to nasal cannula oxygen  Post-op Assessment: Report given to PACU RN, Post -op Vital signs reviewed and stable and Patient moving all extremities X 4  Post vital signs: Reviewed and stable  Complications: No apparent anesthesia complications

## 2013-11-28 NOTE — Anesthesia Preprocedure Evaluation (Addendum)
Anesthesia Evaluation  Patient identified by MRN, date of birth, ID band Patient awake    Reviewed: Allergy & Precautions, H&P , NPO status , Patient's Chart, lab work & pertinent test results  History of Anesthesia Complications Negative for: history of anesthetic complications  Airway Mallampati: II TM Distance: >3 FB Neck ROM: Full    Dental  (+) Teeth Intact, Dental Advisory Given   Pulmonary neg pulmonary ROS,  breath sounds clear to auscultation  Pulmonary exam normal       Cardiovascular negative cardio ROS  Rhythm:Regular Rate:Normal     Neuro/Psych Anxiety negative neurological ROS     GI/Hepatic negative GI ROS, Neg liver ROS,   Endo/Other  Thyroid nodule  Renal/GU negative Renal ROS     Musculoskeletal   Abdominal   Peds  Hematology negative hematology ROS (+)   Anesthesia Other Findings   Reproductive/Obstetrics 11/21/13 preg test NEG                          Anesthesia Physical Anesthesia Plan  ASA: I  Anesthesia Plan: General   Post-op Pain Management:    Induction: Intravenous  Airway Management Planned: Oral ETT  Additional Equipment:   Intra-op Plan:   Post-operative Plan: Extubation in OR  Informed Consent: I have reviewed the patients History and Physical, chart, labs and discussed the procedure including the risks, benefits and alternatives for the proposed anesthesia with the patient or authorized representative who has indicated his/her understanding and acceptance.   Dental advisory given  Plan Discussed with: CRNA, Anesthesiologist and Surgeon  Anesthesia Plan Comments: (Plan routine monitors, GETA)       Anesthesia Quick Evaluation

## 2013-11-28 NOTE — Anesthesia Procedure Notes (Signed)
Procedure Name: Intubation Date/Time: 11/28/2013 7:39 AM Performed by: Quentin OreWALKER, LUZ E Pre-anesthesia Checklist: Patient identified, Emergency Drugs available, Suction available, Patient being monitored and Timeout performed Patient Re-evaluated:Patient Re-evaluated prior to inductionOxygen Delivery Method: Circle system utilized Preoxygenation: Pre-oxygenation with 100% oxygen Intubation Type: IV induction Ventilation: Mask ventilation without difficulty Laryngoscope Size: Mac and 3 Grade View: Grade I Tube type: Oral Tube size: 7.0 mm Number of attempts: 1 Airway Equipment and Method: Stylet Placement Confirmation: ETT inserted through vocal cords under direct vision,  positive ETCO2 and breath sounds checked- equal and bilateral Secured at: 21 cm Tube secured with: Tape Dental Injury: Teeth and Oropharynx as per pre-operative assessment

## 2013-11-28 NOTE — Discharge Instructions (Signed)
It is okay to shower and use soap and water but do not use any creams, oils or ointment on the incision. °

## 2013-11-29 LAB — CALCIUM
CALCIUM: 7 mg/dL — AB (ref 8.4–10.5)
Calcium: 6.7 mg/dL — ABNORMAL LOW (ref 8.4–10.5)
Calcium: 7.5 mg/dL — ABNORMAL LOW (ref 8.4–10.5)

## 2013-11-29 MED ORDER — SODIUM CHLORIDE 0.9 % IV SOLN
2.0000 g | Freq: Once | INTRAVENOUS | Status: AC
  Administered 2013-11-29: 2 g via INTRAVENOUS
  Filled 2013-11-29: qty 20

## 2013-11-29 MED ORDER — CALCIUM CARBONATE ANTACID 500 MG PO CHEW
400.0000 mg | CHEWABLE_TABLET | Freq: Two times a day (BID) | ORAL | Status: DC
  Administered 2013-11-29 – 2013-11-30 (×3): 400 mg via ORAL
  Filled 2013-11-29: qty 2
  Filled 2013-11-29: qty 1
  Filled 2013-11-29: qty 2
  Filled 2013-11-29: qty 1

## 2013-11-29 NOTE — Progress Notes (Signed)
1 Day Post-Op  Subjective: No complaints except that her IV is sore.  Tolerating po's.  Objective: Vital signs in last 24 hours: Temp:  [97.8 F (36.6 C)-99 F (37.2 C)] 98.2 F (36.8 C) (06/27 0641) Pulse Rate:  [60-104] 90 (06/27 0641) Resp:  [9-20] 17 (06/27 0641) BP: (99-137)/(70-106) 99/73 mmHg (06/27 0641) SpO2:  [97 %-100 %] 100 % (06/27 0641)    Intake/Output from previous day: 06/26 0701 - 06/27 0700 In: 3180.5 [P.O.:660; I.V.:2520.5] Out: 1285 [Urine:1175; Drains:85; Blood:25] Intake/Output this shift:    General appearance: alert, cooperative and no distress Neck: thyroidectomy incision clean and intact, no fluid collection, drain functioning.  Normal voice.  Lab Results:  No results found for this basename: WBC, HGB, HCT, PLT,  in the last 72 hours BMET  Recent Labs  11/29/13 0135 11/29/13 0708  CALCIUM 7.0* 6.7*   PT/INR No results found for this basename: LABPROT, INR,  in the last 72 hours ABG No results found for this basename: PHART, PCO2, PO2, HCO3,  in the last 72 hours  Studies/Results: No results found.  Anti-infectives: Anti-infectives   Start     Dose/Rate Route Frequency Ordered Stop   11/28/13 0600  ceFAZolin (ANCEF) IVPB 2 g/50 mL premix     2 g 100 mL/hr over 30 Minutes Intravenous On call to O.R. 11/27/13 1400 11/28/13 0755      Assessment/Plan: s/p Procedure(s): TOTAL THYROIDECTOMY  (Bilateral) Calcium level is falling.  Will start replacement.  Will recheck calcium later today and tomorrow morning.  Continue drain for now.  Possible discharge tomorrow if calcium level stabilizes.  LOS: 1 day    BATES, DWIGHT 11/29/2013

## 2013-11-29 NOTE — Progress Notes (Signed)
[id number] Dr. Jenne PaneBates notified of calcium level was 7.0. Dr Jenne PaneBates stated to get the next calcium level at 0700.  Lab notified and change the next calcium level to 0700. Will continue to monitor patient.

## 2013-11-29 NOTE — Progress Notes (Signed)
UR Completed.  Brown, Sarah Jane [phone] 11/29/2013  

## 2013-11-30 LAB — CALCIUM: CALCIUM: 7.3 mg/dL — AB (ref 8.4–10.5)

## 2013-11-30 MED ORDER — CALCIUM CARBONATE ANTACID 500 MG PO CHEW: 400.0000 mg | CHEWABLE_TABLET | Freq: Two times a day (BID) | ORAL | Status: DC

## 2013-11-30 NOTE — Discharge Summary (Signed)
Physician Discharge Summary  Patient ID: Stephanie Thompson MRN: [id number] DOB/AGE: 220/24/1982 33 y.o.  Admit date: 11/28/2013 Discharge date: 11/30/2013  Admission Diagnoses: Thyroid nodule  Discharge Diagnoses:  Thyroid nodule  Discharged Condition: good  Hospital Course: 33 year old female presented to OR for total thyroidectomy.  See operative note.  Admitted to regular room afterwards with drain in place.  Calcium followed and found to be declining so supplemental calcium was started.  This resulted in staying one more night.  On POD 2, drain removed and felt stable for discharge on supplemental calcium.  Consults: None  Significant Diagnostic Studies: None  Treatments: surgery: Total thyroidectomy  Discharge Exam: Blood pressure 111/69, pulse 85, temperature 98.2 F (36.8 C), temperature source Oral, resp. rate 18, height 5\' 4"  (1.626 m), weight 63.957 kg (141 lb), last menstrual period 11/19/2013, SpO2 99.00%, not currently breastfeeding. General appearance: alert, cooperative and no distress Neck: thyroidectomy incision clean and intact, no fluid collection, drain removed.  Disposition: 01-Home or Self Care  Discharge Instructions   Diet - low sodium heart healthy    Complete by:  As directed      Discharge instructions    Complete by:  As directed   Resume regular diet.  No strenuous activity.  OK to allow incision to get wet, gently pat dry.  Do not use ointment on incision.     Increase activity slowly    Complete by:  As directed             Medication List         calcium carbonate 500 MG chewable tablet  Commonly known as:  TUMS - dosed in mg elemental calcium  Chew 2 tablets (400 mg of elemental calcium total) by mouth 2 (two) times daily.     HYDROcodone-acetaminophen 7.5-325 MG per tablet  Commonly known as:  NORCO  Take 1 tablet by mouth every 6 (six) hours as needed for moderate pain.     promethazine 25 MG suppository  Commonly known as:   PHENERGAN  Place 1 suppository (25 mg total) rectally every 6 (six) hours as needed for nausea or vomiting.     SYNTHROID 100 MCG tablet  Generic drug:  levothyroxine  Take 1 tablet (100 mcg total) by mouth daily.           Follow-up Information   Follow up with Serena ColonelOSEN, JEFRY, MD. Schedule an appointment as soon as possible for a visit on 12/04/2013.   Specialty:  Otolaryngology   Contact information:   [address] [place] [address] [phone]       Signed: Christia ReadingBATES, DWIGHT 11/30/2013, 8:28 AM

## 2013-12-02 ENCOUNTER — Encounter (HOSPITAL_COMMUNITY): Payer: Self-pay | Admitting: Otolaryngology

## 2014-04-06 ENCOUNTER — Encounter (HOSPITAL_COMMUNITY): Payer: Self-pay | Admitting: Otolaryngology

## 2014-07-27 ENCOUNTER — Other Ambulatory Visit: Payer: Self-pay | Admitting: Obstetrics & Gynecology

## 2014-07-28 ENCOUNTER — Encounter (HOSPITAL_COMMUNITY): Payer: Self-pay | Admitting: *Deleted

## 2014-07-30 MED ORDER — CEFAZOLIN SODIUM-DEXTROSE 2-3 GM-% IV SOLR
2.0000 g | INTRAVENOUS | Status: AC
  Administered 2014-07-31: 2 g via INTRAVENOUS

## 2014-07-31 ENCOUNTER — Encounter (HOSPITAL_COMMUNITY): Payer: Self-pay | Admitting: Anesthesiology

## 2014-07-31 ENCOUNTER — Ambulatory Visit (HOSPITAL_COMMUNITY): Payer: BLUE CROSS/BLUE SHIELD | Admitting: Anesthesiology

## 2014-07-31 ENCOUNTER — Ambulatory Visit (HOSPITAL_COMMUNITY)
Admission: RE | Admit: 2014-07-31 | Discharge: 2014-07-31 | Disposition: A | Discharge status: Home / Self Care | Payer: BLUE CROSS/BLUE SHIELD | Source: Ambulatory Visit | Attending: Obstetrics & Gynecology | Admitting: Obstetrics & Gynecology

## 2014-07-31 ENCOUNTER — Encounter (HOSPITAL_COMMUNITY)
Admission: RE | Disposition: A | Discharge status: Home / Self Care | Payer: Self-pay | Source: Ambulatory Visit | Attending: Obstetrics & Gynecology

## 2014-07-31 DIAGNOSIS — Z79899 Other long term (current) drug therapy: Secondary | ICD-10-CM | POA: Diagnosis not present

## 2014-07-31 DIAGNOSIS — N3641 Hypermobility of urethra: Secondary | ICD-10-CM | POA: Diagnosis not present

## 2014-07-31 DIAGNOSIS — Z79891 Long term (current) use of opiate analgesic: Secondary | ICD-10-CM | POA: Insufficient documentation

## 2014-07-31 DIAGNOSIS — N393 Stress incontinence (female) (male): Secondary | ICD-10-CM | POA: Diagnosis present

## 2014-07-31 DIAGNOSIS — Z8052 Family history of malignant neoplasm of bladder: Secondary | ICD-10-CM | POA: Insufficient documentation

## 2014-07-31 DIAGNOSIS — E890 Postprocedural hypothyroidism: Secondary | ICD-10-CM | POA: Diagnosis not present

## 2014-07-31 HISTORY — PX: BLADDER SUSPENSION: SHX72

## 2014-07-31 HISTORY — DX: Hypothyroidism, unspecified: E03.9

## 2014-07-31 HISTORY — PX: CYSTOSCOPY: SHX5120

## 2014-07-31 LAB — CBC
HEMATOCRIT: 41 % (ref 36.0–46.0)
Hemoglobin: 14.3 g/dL (ref 12.0–15.0)
MCH: 32.9 pg (ref 26.0–34.0)
MCHC: 34.9 g/dL (ref 30.0–36.0)
MCV: 94.3 fL (ref 78.0–100.0)
Platelets: 337 10*3/uL (ref 150–400)
RBC: 4.35 MIL/uL (ref 3.87–5.11)
RDW: 12 % (ref 11.5–15.5)
WBC: 7.8 10*3/uL (ref 4.0–10.5)

## 2014-07-31 LAB — PREGNANCY, URINE: Preg Test, Ur: NEGATIVE

## 2014-07-31 SURGERY — URETHROPEXY, USING TRANSVAGINAL TAPE
Anesthesia: General | Site: Urethra

## 2014-07-31 MED ORDER — SCOPOLAMINE 1 MG/3DAYS TD PT72
1.0000 | MEDICATED_PATCH | Freq: Once | TRANSDERMAL | Status: DC
  Administered 2014-07-31: 1.5 mg via TRANSDERMAL

## 2014-07-31 MED ORDER — PROMETHAZINE HCL 25 MG/ML IJ SOLN: 6.2500 mg | INTRAMUSCULAR | Status: DC | PRN

## 2014-07-31 MED ORDER — LACTATED RINGERS IV SOLN
INTRAVENOUS | Status: DC
  Administered 2014-07-31 (×2): via INTRAVENOUS

## 2014-07-31 MED ORDER — MIDAZOLAM HCL 2 MG/2ML IJ SOLN: 0.5000 mg | Freq: Once | INTRAMUSCULAR | Status: DC | PRN

## 2014-07-31 MED ORDER — MEPERIDINE HCL 25 MG/ML IJ SOLN: 6.2500 mg | INTRAMUSCULAR | Status: DC | PRN

## 2014-07-31 MED ORDER — KETOROLAC TROMETHAMINE 30 MG/ML IJ SOLN: 30.0000 mg | Freq: Once | INTRAMUSCULAR | Status: DC | PRN

## 2014-07-31 MED ORDER — ACETAMINOPHEN 160 MG/5ML PO SOLN: 325.0000 mg | ORAL | Status: DC | PRN

## 2014-07-31 MED ORDER — MIDAZOLAM HCL 2 MG/2ML IJ SOLN
INTRAMUSCULAR | Status: AC
  Filled 2014-07-31: qty 2

## 2014-07-31 MED ORDER — MIDAZOLAM HCL 2 MG/2ML IJ SOLN
INTRAMUSCULAR | Status: DC | PRN
  Administered 2014-07-31: 2 mg via INTRAVENOUS

## 2014-07-31 MED ORDER — PROPOFOL 10 MG/ML IV BOLUS
INTRAVENOUS | Status: DC | PRN
  Administered 2014-07-31: 180 mg via INTRAVENOUS

## 2014-07-31 MED ORDER — OXYCODONE-ACETAMINOPHEN 5-325 MG PO TABS
ORAL_TABLET | ORAL | Status: AC
  Filled 2014-07-31: qty 1

## 2014-07-31 MED ORDER — OXYCODONE-ACETAMINOPHEN 5-325 MG PO TABS: 1.0000 | ORAL_TABLET | ORAL | Status: AC | PRN

## 2014-07-31 MED ORDER — ACETAMINOPHEN 325 MG PO TABS: 325.0000 mg | ORAL_TABLET | ORAL | Status: DC | PRN

## 2014-07-31 MED ORDER — FENTANYL CITRATE 0.05 MG/ML IJ SOLN
INTRAMUSCULAR | Status: DC | PRN
Start: 2014-07-31 — End: 2014-07-31
  Administered 2014-07-31 (×3): 50 ug via INTRAVENOUS

## 2014-07-31 MED ORDER — LIDOCAINE HCL (CARDIAC) 20 MG/ML IV SOLN
INTRAVENOUS | Status: AC
  Filled 2014-07-31: qty 5

## 2014-07-31 MED ORDER — SODIUM CHLORIDE 0.9 % IJ SOLN
INTRAMUSCULAR | Status: AC
Start: 2014-07-31 — End: 2014-07-31
  Filled 2014-07-31: qty 10

## 2014-07-31 MED ORDER — SODIUM CHLORIDE 0.9 % IV SOLN
INTRAVENOUS | Status: DC | PRN
  Administered 2014-07-31: 50 mL via INTRAMUSCULAR

## 2014-07-31 MED ORDER — ONDANSETRON HCL 4 MG/2ML IJ SOLN
INTRAMUSCULAR | Status: AC
  Filled 2014-07-31: qty 2

## 2014-07-31 MED ORDER — CEFAZOLIN SODIUM-DEXTROSE 2-3 GM-% IV SOLR
INTRAVENOUS | Status: AC
  Filled 2014-07-31: qty 50

## 2014-07-31 MED ORDER — KETOROLAC TROMETHAMINE 30 MG/ML IJ SOLN
INTRAMUSCULAR | Status: DC | PRN
  Administered 2014-07-31: 30 mg via INTRAVENOUS

## 2014-07-31 MED ORDER — 0.9 % SODIUM CHLORIDE (POUR BTL) OPTIME
TOPICAL | Status: DC | PRN
  Administered 2014-07-31: 1000 mL

## 2014-07-31 MED ORDER — SODIUM CHLORIDE 0.9 % IJ SOLN
INTRAMUSCULAR | Status: AC
  Filled 2014-07-31: qty 50

## 2014-07-31 MED ORDER — PROPOFOL 10 MG/ML IV BOLUS
INTRAVENOUS | Status: AC
  Filled 2014-07-31: qty 20

## 2014-07-31 MED ORDER — KETOROLAC TROMETHAMINE 30 MG/ML IJ SOLN
INTRAMUSCULAR | Status: AC
  Filled 2014-07-31: qty 1

## 2014-07-31 MED ORDER — DEXAMETHASONE SODIUM PHOSPHATE 10 MG/ML IJ SOLN
INTRAMUSCULAR | Status: DC | PRN
  Administered 2014-07-31: 4 mg via INTRAVENOUS

## 2014-07-31 MED ORDER — SCOPOLAMINE 1 MG/3DAYS TD PT72
MEDICATED_PATCH | TRANSDERMAL | Status: AC
  Administered 2014-07-31: 1.5 mg via TRANSDERMAL
  Filled 2014-07-31: qty 1

## 2014-07-31 MED ORDER — IBUPROFEN 600 MG PO TABS: 600.0000 mg | ORAL_TABLET | Freq: Four times a day (QID) | ORAL | Status: AC | PRN

## 2014-07-31 MED ORDER — ONDANSETRON HCL 4 MG/2ML IJ SOLN
INTRAMUSCULAR | Status: DC | PRN
  Administered 2014-07-31: 4 mg via INTRAVENOUS

## 2014-07-31 MED ORDER — FENTANYL CITRATE 0.05 MG/ML IJ SOLN
INTRAMUSCULAR | Status: AC
  Filled 2014-07-31: qty 2

## 2014-07-31 MED ORDER — VASOPRESSIN 20 UNIT/ML IV SOLN
INTRAVENOUS | Status: AC
  Filled 2014-07-31: qty 1

## 2014-07-31 MED ORDER — DEXAMETHASONE SODIUM PHOSPHATE 4 MG/ML IJ SOLN
INTRAMUSCULAR | Status: AC
  Filled 2014-07-31: qty 1

## 2014-07-31 MED ORDER — FENTANYL CITRATE 0.05 MG/ML IJ SOLN: 25.0000 ug | INTRAMUSCULAR | Status: DC | PRN

## 2014-07-31 MED ORDER — OXYCODONE-ACETAMINOPHEN 5-325 MG PO TABS
1.0000 | ORAL_TABLET | ORAL | Status: DC | PRN
  Administered 2014-07-31: 1 via ORAL

## 2014-07-31 MED ORDER — LIDOCAINE HCL (CARDIAC) 20 MG/ML IV SOLN
INTRAVENOUS | Status: DC | PRN
  Administered 2014-07-31: 70 mg via INTRAVENOUS
  Administered 2014-07-31: 30 mg via INTRAVENOUS

## 2014-07-31 SURGICAL SUPPLY — 25 items
BAG URINE DRAINAGE (UROLOGICAL SUPPLIES) ×4 IMPLANT
BLADE SURG 15 STRL LF C SS BP (BLADE) ×4 IMPLANT
BLADE SURG 15 STRL SS (BLADE) ×8
CANISTER SUCT 3000ML (MISCELLANEOUS) ×4 IMPLANT
CATH FOLEY 2WAY SLVR  5CC 16FR (CATHETERS) ×2
CATH FOLEY 2WAY SLVR  5CC 18FR (CATHETERS) ×2
CATH FOLEY 2WAY SLVR 5CC 16FR (CATHETERS) ×2 IMPLANT
CATH FOLEY 2WAY SLVR 5CC 18FR (CATHETERS) ×2 IMPLANT
CLOTH BEACON ORANGE TIMEOUT ST (SAFETY) ×4 IMPLANT
DECANTER SPIKE VIAL GLASS SM (MISCELLANEOUS) ×2 IMPLANT
DRAPE HYSTEROSCOPY (DRAPE) ×4 IMPLANT
GLOVE BIO SURGEON STRL SZ7 (GLOVE) ×4 IMPLANT
GLOVE BIOGEL PI IND STRL 7.0 (GLOVE) ×2 IMPLANT
GLOVE BIOGEL PI INDICATOR 7.0 (GLOVE) ×2
GOWN STRL REUS W/TWL LRG LVL3 (GOWN DISPOSABLE) ×16 IMPLANT
LIQUID BAND (GAUZE/BANDAGES/DRESSINGS) ×4 IMPLANT
NEEDLE HYPO 22GX1.5 SAFETY (NEEDLE) ×2 IMPLANT
NS IRRIG 1000ML POUR BTL (IV SOLUTION) ×4 IMPLANT
PACK VAGINAL WOMENS (CUSTOM PROCEDURE TRAY) ×4 IMPLANT
SET CYSTO W/LG BORE CLAMP LF (SET/KITS/TRAYS/PACK) ×4 IMPLANT
SLING TVT EXACT (Sling) ×2 IMPLANT
SUT VIC AB 2-0 SH 27 (SUTURE) ×4
SUT VIC AB 2-0 SH 27XBRD (SUTURE) ×2 IMPLANT
TOWEL OR 17X24 6PK STRL BLUE (TOWEL DISPOSABLE) ×8 IMPLANT
WATER STERILE IRR 1000ML POUR (IV SOLUTION) ×4 IMPLANT

## 2014-07-31 NOTE — Anesthesia Preprocedure Evaluation (Addendum)
Anesthesia Evaluation  Patient identified by MRN, date of birth, ID band Patient awake    Reviewed: Allergy & Precautions, H&P , Patient's Chart, lab work & pertinent test results, reviewed documented beta blocker date and time   History of Anesthesia Complications Negative for: history of anesthetic complications  Airway Mallampati: II  TM Distance: >3 FB Neck ROM: full    Dental   Pulmonary  breath sounds clear to auscultation        Cardiovascular Exercise Tolerance: Good Rhythm:regular Rate:Normal     Neuro/Psych negative psych ROS   GI/Hepatic   Endo/Other  Hypothyroidism   Renal/GU      Musculoskeletal   Abdominal   Peds  Hematology   Anesthesia Other Findings   Reproductive/Obstetrics                             Anesthesia Physical Anesthesia Plan  ASA: II  Anesthesia Plan: General LMA   Post-op Pain Management:    Induction:   Airway Management Planned:   Additional Equipment:   Intra-op Plan:   Post-operative Plan:   Informed Consent: I have reviewed the patients History and Physical, chart, labs and discussed the procedure including the risks, benefits and alternatives for the proposed anesthesia with the patient or authorized representative who has indicated his/her understanding and acceptance.   Dental Advisory Given  Plan Discussed with: CRNA, Surgeon and Anesthesiologist  Anesthesia Plan Comments:         Anesthesia Quick Evaluation  

## 2014-07-31 NOTE — Anesthesia Procedure Notes (Addendum)
Performed by: Suella GroveMOORE, Mallorey Odonell C   Procedure Name: LMA Insertion Date/Time: 07/31/2014 11:13 AM Performed by: Suella GroveMOORE, Idy Rawling C Pre-anesthesia Checklist: Patient identified, Emergency Drugs available, Suction available, Patient being monitored and Timeout performed Patient Re-evaluated:Patient Re-evaluated prior to inductionOxygen Delivery Method: Circle system utilized and Simple face mask Preoxygenation: Pre-oxygenation with 100% oxygen Intubation Type: IV induction Ventilation: Mask ventilation without difficulty LMA Size: 4.0 Grade View: Grade II Number of attempts: 1 Dental Injury: Teeth and Oropharynx as per pre-operative assessment

## 2014-07-31 NOTE — H&P (Signed)
Stephanie Thompson is an 34 y.o. female [phone] [id number] with severe stress urinary incontinence affecting her quality of life who desires continence and is here for mid-urethral sling.  Denies stool incontinence, denies prolapse/pelvic pressure/bulge. Normal menses, normal Paps.   Patient's last menstrual period was 07/24/2014 (exact date).    Past Medical History  Diagnosis Date  . Enlarged thyroid   . Hx of ovarian cyst   . Hx of varicella   . SVD (spontaneous vaginal delivery)     x 2  . Hypothyroidism   . Anxiety     over needle sticks and IV - no meds    Past Surgical History  Procedure Laterality Date  . Oophorectomy      left - lap  . Thyroidectomy Bilateral 11/28/2013    Procedure: TOTAL THYROIDECTOMY ;  Surgeon: Serena ColonelJefry Rosen, MD;  [facility];  Service: ENT;  Laterality: Bilateral;  . Wisdom tooth extraction      Family History  Problem Relation Age of Onset  . Hyperthyroidism Mother   . Cancer Father     prostate  . Cancer Maternal Grandmother     bladder  . Cancer Paternal Grandmother     lung    Social History:  reports that she has never smoked. She has never used smokeless tobacco. She reports that she does not drink alcohol or use illicit drugs.  Allergies: Not on File  Prescriptions prior to admission  Medication Sig Dispense Refill Last Dose  . HYDROcodone-acetaminophen (NORCO) 7.5-325 MG per tablet Take 1 tablet by mouth every 6 (six) hours as needed for moderate pain. (Patient not taking: Reported on 07/27/2014) 30 tablet 0   . promethazine (PHENERGAN) 25 MG suppository Place 1 suppository (25 mg total) rectally every 6 (six) hours as needed for nausea or vomiting. (Patient not taking: Reported on 07/27/2014) 12 suppository 0   . SYNTHROID 100 MCG tablet Take 1 tablet (100 mcg total) by mouth daily. 30 tablet 6     ROS  Neg   Physical Exam Ht 5\' 4"  (1.626 m)  Wt 160 lb (72.576 kg)  BMI 27.45 kg/m2  LMP 07/24/2014 (Exact Date)  A&O x 3, no acute  distress. Pleasant HEENT neg Lungs CTA bilat CV RRR, S1S2 normal Abdo soft, non tender, non acute Extr no edema/ tenderness Pelvic: Uterus, cervix normal, no prolapse noted with bearing down. Urethral hypermobility noted.  Urine culture negative in office.   Office Urodynamics: No detrusor overactivity, normal capacity and compliance and normal sensations. Normal urethral pressure profile. Normal flowmetry and pressure/flow study. Demonstrable stress urinary incontinence with cough and valsalva. Valsalva pressures at leak intermediate.   Assessment/Plan: Genuine stress urinary incontinence, urethral hypermobility, normal pressure urethra. TVT (exact) mid urethral sling and cystoscopy.   Options reviewed including expectant management, using incontinence tampons/ pads, pelvic floor PT, seeing Urologist but she declined all and wants surgical intervention for better success at continence.  TVT/mid-urethral sling and cystoscopy reviewed, risks/complications esp bladder perforation/ bleeding/ post-op retention and need for short or long term catheter use, need to re-operate for retention/ cut sling and long term problems de novo urgency, mesh erosion, dyspareunia etc. Reviewed overall 12-13 yr data with good 85% satisfaction reviewed.  Patient understands and agrees.    Rakel Junio R 07/31/2014, 10:11 AM

## 2014-07-31 NOTE — Anesthesia Postprocedure Evaluation (Signed)
  Anesthesia Post Note  Patient: Stephanie Thompson  Procedure(s) Performed: Procedure(s) (LRB): TRANSVAGINAL TAPE (TVT) Exact Mid-Urethral Sling PROCEDURE (N/A) CYSTOSCOPY (N/A)  Anesthesia type: GA  Patient location: PACU  Post pain: Pain level controlled  Post assessment: Post-op Vital signs reviewed  Last Vitals:  Filed Vitals:   07/31/14 1245  BP: 104/70  Pulse: 81  Temp:   Resp: 21    Post vital signs: Reviewed  Level of consciousness: sedated  Complications: No apparent anesthesia complications

## 2014-07-31 NOTE — Op Note (Signed)
Preoperative diagnosis: Stress urinary incontinence, urethral hypermobility  Postoperative diagnosis: stress urinary incontinence, urethral hypermobility Procedure: Tension-free vaginal tape- Retropubic Sling (TVT- Exact), Cystoscopy Surgeon: Shea Evans, MD Assistant: none Anesthesia: General, via LMA Antibiotics: 1 g Ancef Complications: None Estimated blood loss: 10 cc  Findings: Hypermobile gaping urethra, no cystocele or rectocele or uterovaginal prolapse. Normal vaginal mucosa.  Cystoscopy findings: Normal bladder and urethra, no sling material seen in bladder or urethra during cystoscopy and bilateral urine efflux noted from ureteral openings.  Indications: 34 yo G2P2 with 2 SVDs and denies plans of further child bearing. She was noted to have Genuine stress urinary incontinence with hypermobile urethra but normal pressure study. Patient wanted surgical management.  Patient was counseled on the risk and benefits of this procedure including the risk of urinary retention (and possibly needing to go home with a catheter), risk of sling erosion, immediate surgical and anaesthesia risks,  and possibly worsening or causing de-novo urge incontinence. As stress incontinence is her main complaint,  patient consented to TVT sling.  Procedure: After informed consent was obtained from the patient she was taken to the operating room where general anesthesia was initiated without difficulty. Times out was carried out. Mons pubis was shaved, pubic symphysis was marked for the planned bilateral exit points, 2 cm lateral to the midline just above the a pubic symphysis on both sides.  A Foley catheter was then placed to drain the bladder the balloon was inflated and used to help identify the bladder and to plan the sling placement in the mid urethral position. A solution of 20 units Pitressin in 60 cc of normal saline was used as the injection (50 cc total used). 20 cc of this solution was injected into the  suprapubic space bilaterally with spinal needle from the planned abdominal exit points behind the pubic symphysis and into the retropubic space. This space was confirmed with the use of the hand in the vagina to feel the needle tip prior to aspirating and injecting. Two Allis clamps placed 2 cm below the urethral meatus (in the area of mid urethra) on either side of the midline. 20 cc of the injecting solution injected between the 2 Allis clamps with lateral spread in the area of vaginal tunnels and 10 cc around vaginal incision area.  A #15 blade was used to incise between the Allis clamps and with Metzenbaum scissors vaginal mucosa dissected to creat tunnels toward retropubic space under the pubic ramus in the direction of ipsilateral shoulder.  Once the planned entry routes were dissected a rigid Foley guide was placed through the foley into the bladder. Bladder neck was deviated to the left by moving the guide to patient's right thigh and right retropubic space was targeted. TVT sling plastic sleeve was mounted on blunt 3mm trocar and was inserted into the pre-dissected space and was pushed under the pubic bone, and while hugging the bone and dropping the trocar handle towards the ground it was directed to the abdominal wall, bringing it out through the premarked right exit incision. A Kelly clamp was placed on the plastic sleeve tip and the trocar was removed through the vaginal incision. The trocar was placed in the opposite TVT sling sleeve. Bladder neck was deviated to right by moving the foley with guide to the left thigh and opening up left retropubic space and the trocar was inserted into the left pre-dissected space and was pushed under the pubic bone, and while hugging the bone and dropping the trocar  handle towards the ground it was directed to the abdominal wall, bringing it out through the premarked left exit incision. Kelly clamp placed on plastic sleeve and metal trocar removed.  Patient received  1 ampule of indigo carmine dye IV. Cystoscopy was performed with saline for irrigation. Bladder was filled to 450 cc and evaluation was done of the entire bladder mucosa and the urethra to ensure no sling material was eroding through the mucosa. FIndings were clear. Bilateral urine efflux noted from ureteral opening, no evidence of bladder injury noted. Cystoscope removed and bladder emptied with foley and foley was left in place.  Sling was pulled from both exit sites while leave a Mayo scissors between sling and vaginal space to ensure tension-free placement. Plastic covers on sling were removed by gentle pull and excess sling cut at the abdominal exit sites. Exit sites were closed with dermabond.  Good hemostasis was noted. The vaginal mucosa was closed with 3-0 vicryl.  The patient tolerated the procedure well. Sponge, lap and needle counts were correct x2 and the patient was taken to the recovery room in stable condition. Bladder voiding trial will be performed in PACU.  Stephanie Mataya, MD

## 2014-07-31 NOTE — Transfer of Care (Signed)
Immediate Anesthesia Transfer of Care Note  Patient: Stephanie Thompson  Procedure(s) Performed: Procedure(s): TRANSVAGINAL TAPE (TVT) Exact Mid-Urethral Sling PROCEDURE (N/A) CYSTOSCOPY (N/A)  Patient Location: PACU  Anesthesia Type:General  Level of Consciousness: awake, alert  and oriented  Airway & Oxygen Therapy: Patient Spontanous Breathing and Patient connected to nasal cannula oxygen  Post-op Assessment: Report given to RN and Post -op Vital signs reviewed and stable  Post vital signs: Reviewed and stable  Last Vitals:  Filed Vitals:   07/31/14 1014  BP: 127/77  Pulse: 84  Temp: 37.1 C  Resp: 18    Complications: No apparent anesthesia complications

## 2014-07-31 NOTE — OR Nursing (Signed)
13:45 300cc sodium chloride instilled into bladder via cathetor per Dr. Frederik PearModys oders. Foley cathetor dc'd . Pt assisted to restroom to void. Margarita Mailoni Zahid Carneiro rn

## 2014-07-31 NOTE — Discharge Instructions (Addendum)
Urethral Vaginal Sling, Care After Refer to this sheet in the next few weeks. These instructions provide you with information on caring for yourself after your procedure. Your health care provider may also give you more specific instructions. Your treatment has been planned according to current medical practices, but problems sometimes occur. Call your health care provider if you have any problems or questions after your procedure.  WHAT TO EXPECT AFTER THE PROCEDURE  After your procedure, it is typical to have the following:  A catheter in your bladder until your bladder is able to work on its own properly. You will be instructed on how to empty the catheter bag.  Absorbable stitches in your incisions. They will slowly dissolve over 1-2 months. HOME CARE INSTRUCTIONS  Get plenty of rest.  Only take over-the-counter or prescription medicines as directed by your health care provider. Do not take aspirin because it can cause bleeding.  Do not take baths. Take showers until your health care provider tells you otherwise.  You may resume your usual diet. Eat a well-balanced diet.  Drink enough fluids to keep your urine clear or pale yellow.  Limit exercise and activities as directed by your health care provider. Do not lift anything heavier than 5 pounds (2.3 kg).  Do not douche, use tampons, or have sexual intercourse for 6 weeks after your procedure.  Follow up with your health care provider as directed. SEEK MEDICAL CARE IF:  You have a heavy or bad smelling vaginal discharge.   You have a rash.   You have pain that is not controlled with medicines.   You have lightheadedness or feel faint.  SEEK IMMEDIATE MEDICAL CARE IF:  You have a fever.   You have vaginal bleeding.   You faint.   You have shortness of breath.   You have chest, abdominal, or leg pain.   You have pain when urinating or cannot urinate.   Your catheter is still in your bladder and becomes  blocked.   You have swelling, redness, and pain in the vaginal area.  Document Released: 03/12/2013 Document Reviewed: 03/12/2013 [facility] Patient Information 2015 [place], [place]. This information is not intended to replace advice given to you by your health care provider. Make sure you discuss any questions you have with your health care provider.   DISCHARGE INSTRUCTIONS: D&C / D&E The following instructions have been prepared to help you care for yourself upon your return home.   Personal hygiene:  Use sanitary pads for vaginal drainage, not tampons.  Shower the day after your procedure.  NO tub baths, pools or Jacuzzis for 2-3 weeks.  Wipe front to back after using the bathroom.  Activity and limitations:  Do NOT drive or operate any equipment for 24 hours. The effects of anesthesia are still present and drowsiness may result.  Do NOT rest in bed all day.  Walking is encouraged.  Walk up and down stairs slowly.  You may resume your normal activity in one to two days or as indicated by your physician.  Sexual activity: NO intercourse for at least 2 weeks after the procedure, or as indicated by your physician.  Diet: Eat a light meal as desired this evening. You may resume your usual diet tomorrow.  Return to work: You may resume your work activities in one to two days or as indicated by your doctor.  What to expect after your surgery: Expect to have vaginal bleeding/discharge for 2-3 days and spotting for up to 10 days.  It is not unusual to have soreness for up to 1-2 weeks. You may have a slight burning sensation when you urinate for the first day. Mild cramps may continue for a couple of days. You may have a regular period in 2-6 weeks.  Call your doctor for any of the following:  Excessive vaginal bleeding, saturating and changing one pad every hour.  Inability to urinate 6 hours after discharge from hospital.  Pain not relieved by pain medication.  Fever of  100.4 F or greater.  Unusual vaginal discharge or odor.   Call for an appointment:    Patients signature: ______________________  Nurses signature ________________________  Support person's signature_______________________

## 2014-08-04 ENCOUNTER — Encounter (HOSPITAL_COMMUNITY): Payer: Self-pay | Admitting: Obstetrics & Gynecology

## 2015-05-24 IMAGING — US US THYROID BIOPSY
1 series · 14 of 14 positions shown · non-contrast
Comparison: None.

CLINICAL DATA: Two dominant left thyroid nodules.

EXAM:
ULTRASOUND GUIDED NEEDLE ASPIRATE BIOPSY OF THE THYROID GLAND

[Series 1: us thyroid biopsy · 0.08mm/px · 14 acquisitions, 14 frames shown]
[im 1/14]
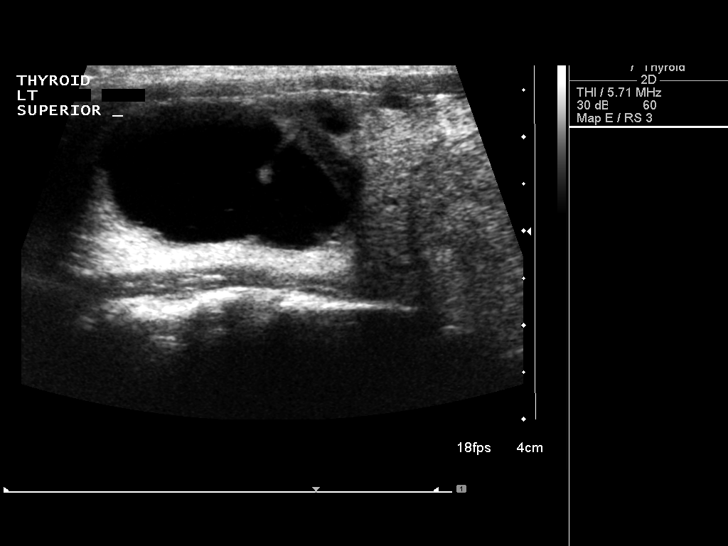
[im 2/14]
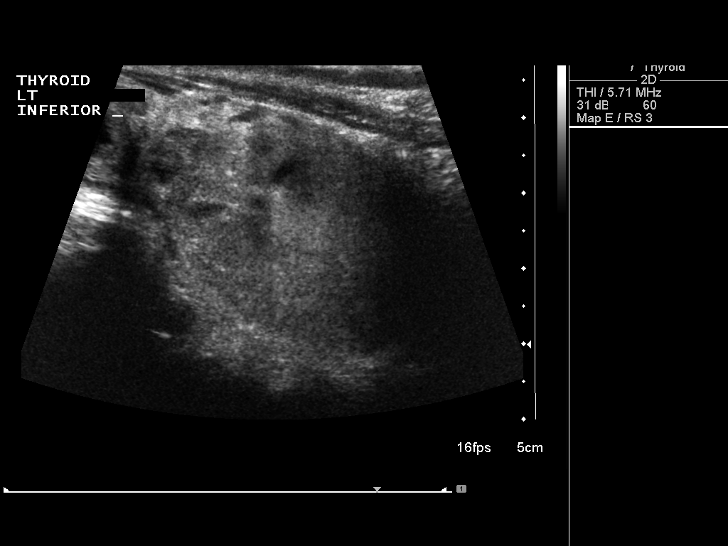
[im 3/14]
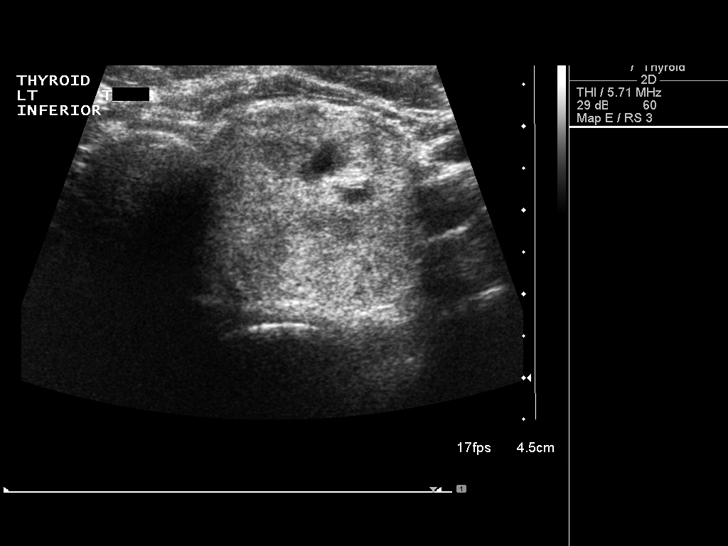
[im 4/14]
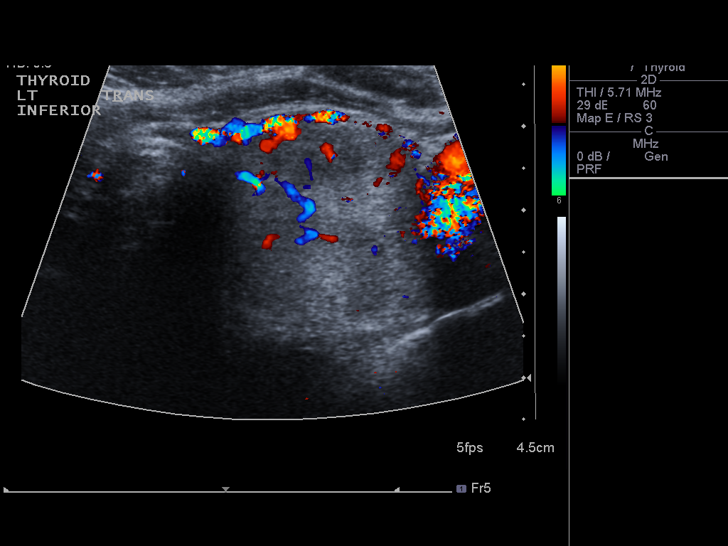
[im 5/14]
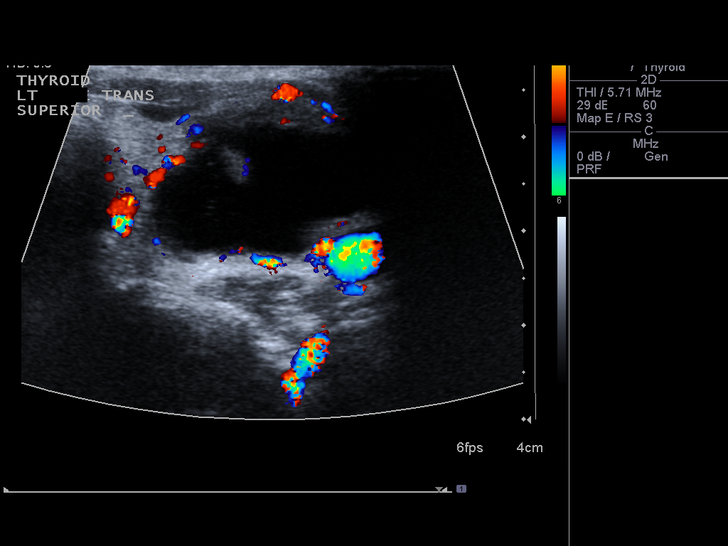
[im 6/14]
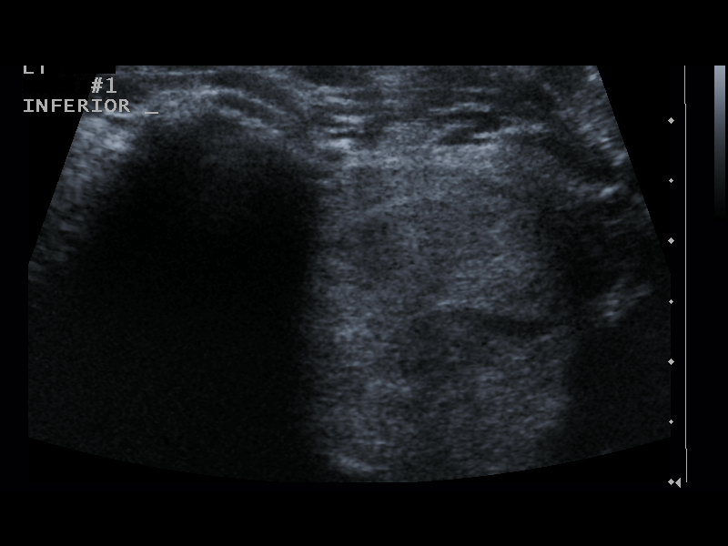
[im 7/14]
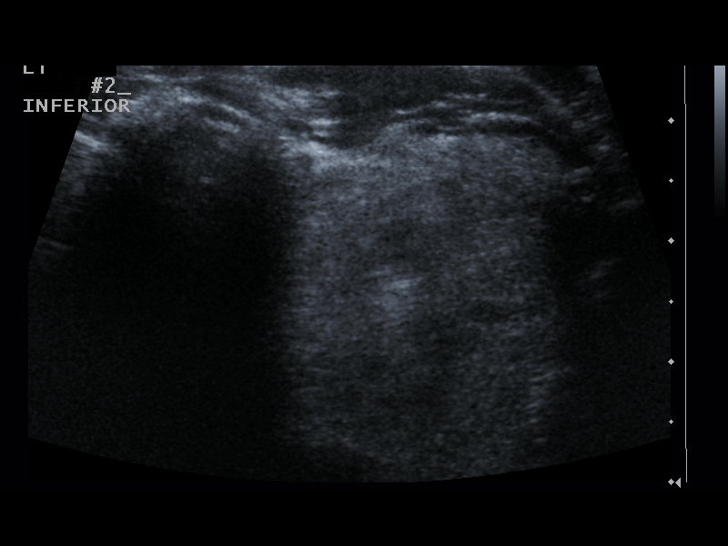
[im 8/14]
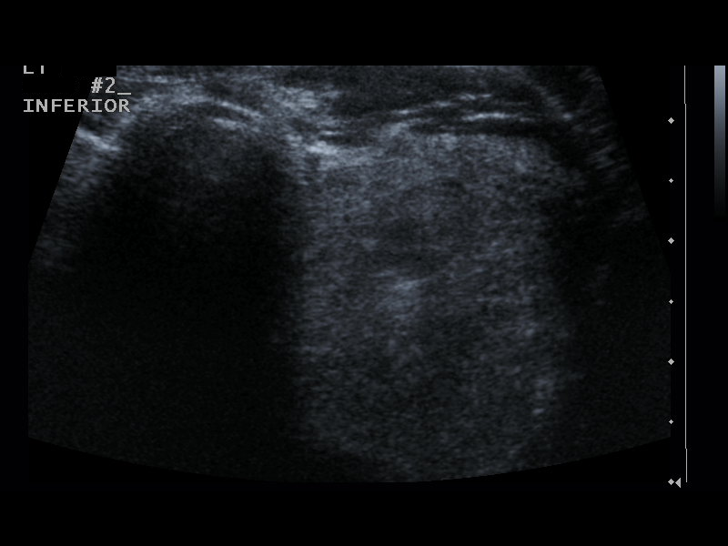
[im 9/14]
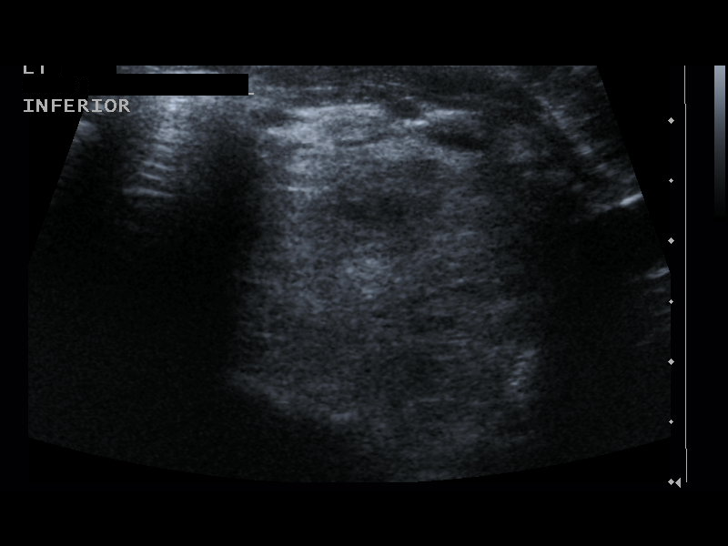
[im 10/14]
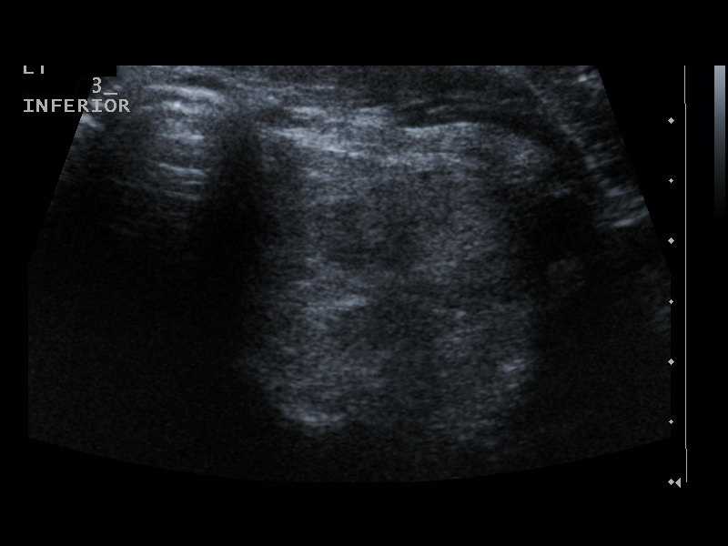
[im 11/14]
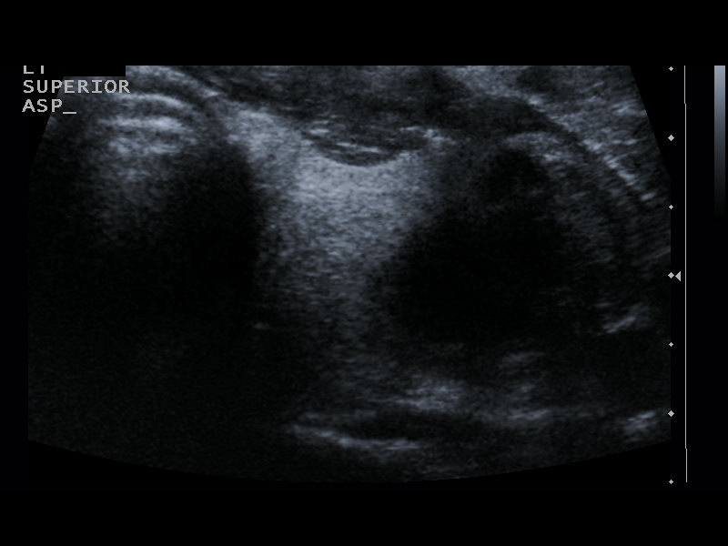
[im 12/14]
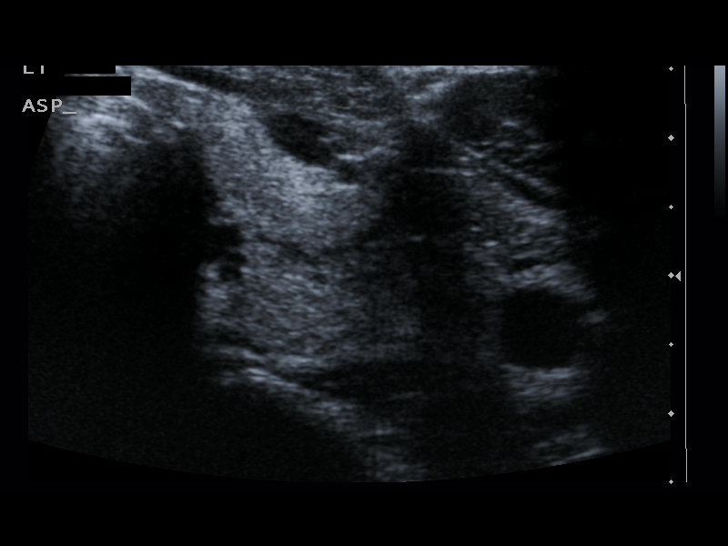
[im 13/14]
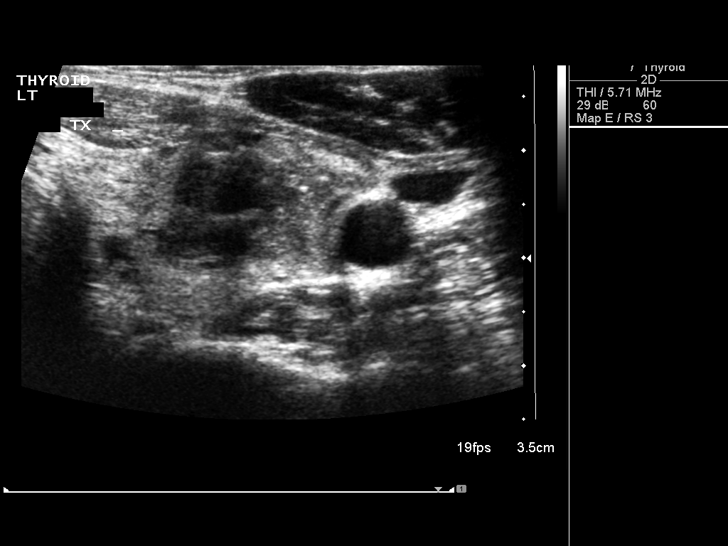
[im 14/14]
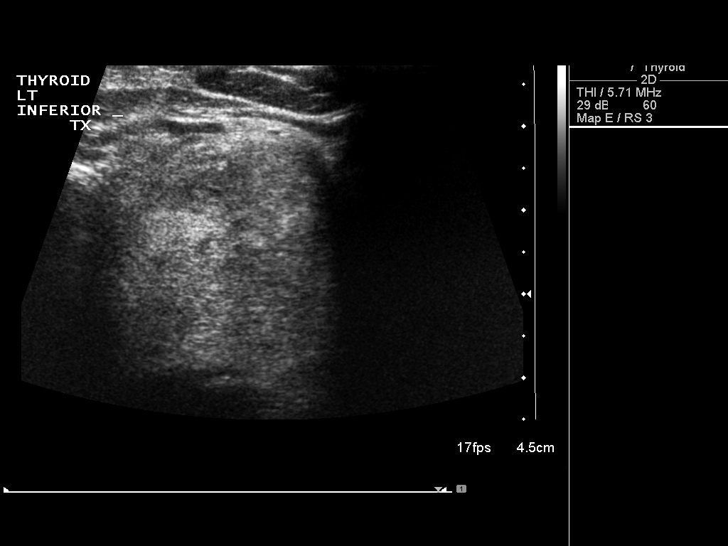

[14 of 14 positions shown; findings below may reference images not displayed]

PROCEDURE:
Thyroid biopsy was thoroughly discussed with the patient and
questions were answered. The benefits, risks, alternatives, and
complications were also discussed. The patient understands and
wishes to proceed with the procedure. Written consent was obtained.

Ultrasound was performed to localize and mark an adequate site for
the biopsy. The patient was then prepped and draped in a normal
sterile fashion. Local anesthesia was provided with 1% lidocaine.
Using direct ultrasound guidance, 3 passes were made using needles
into the nodule within the left lobe of the thyroid. Ultrasound was
used to confirm needle placements on all occasions. Specimens were
sent to Pathology for analysis. An 18 gauge needle was then utilized
to aspirate the more superior predominately cystic nodule.

Complications:  None.
FINDINGS: Images document needle placement in the 2 left nodules as described.
IMPRESSION: Ultrasound guided needle aspirate biopsy performed of the left
thyroid nodules.

## 2015-06-30 IMAGING — CR DG CHEST 2V
2 series · 2 of 2 positions shown · non-contrast
Comparison: None.

CLINICAL DATA: Thyroidectomy

EXAM:
CHEST  2 VIEW

[w chest pa]
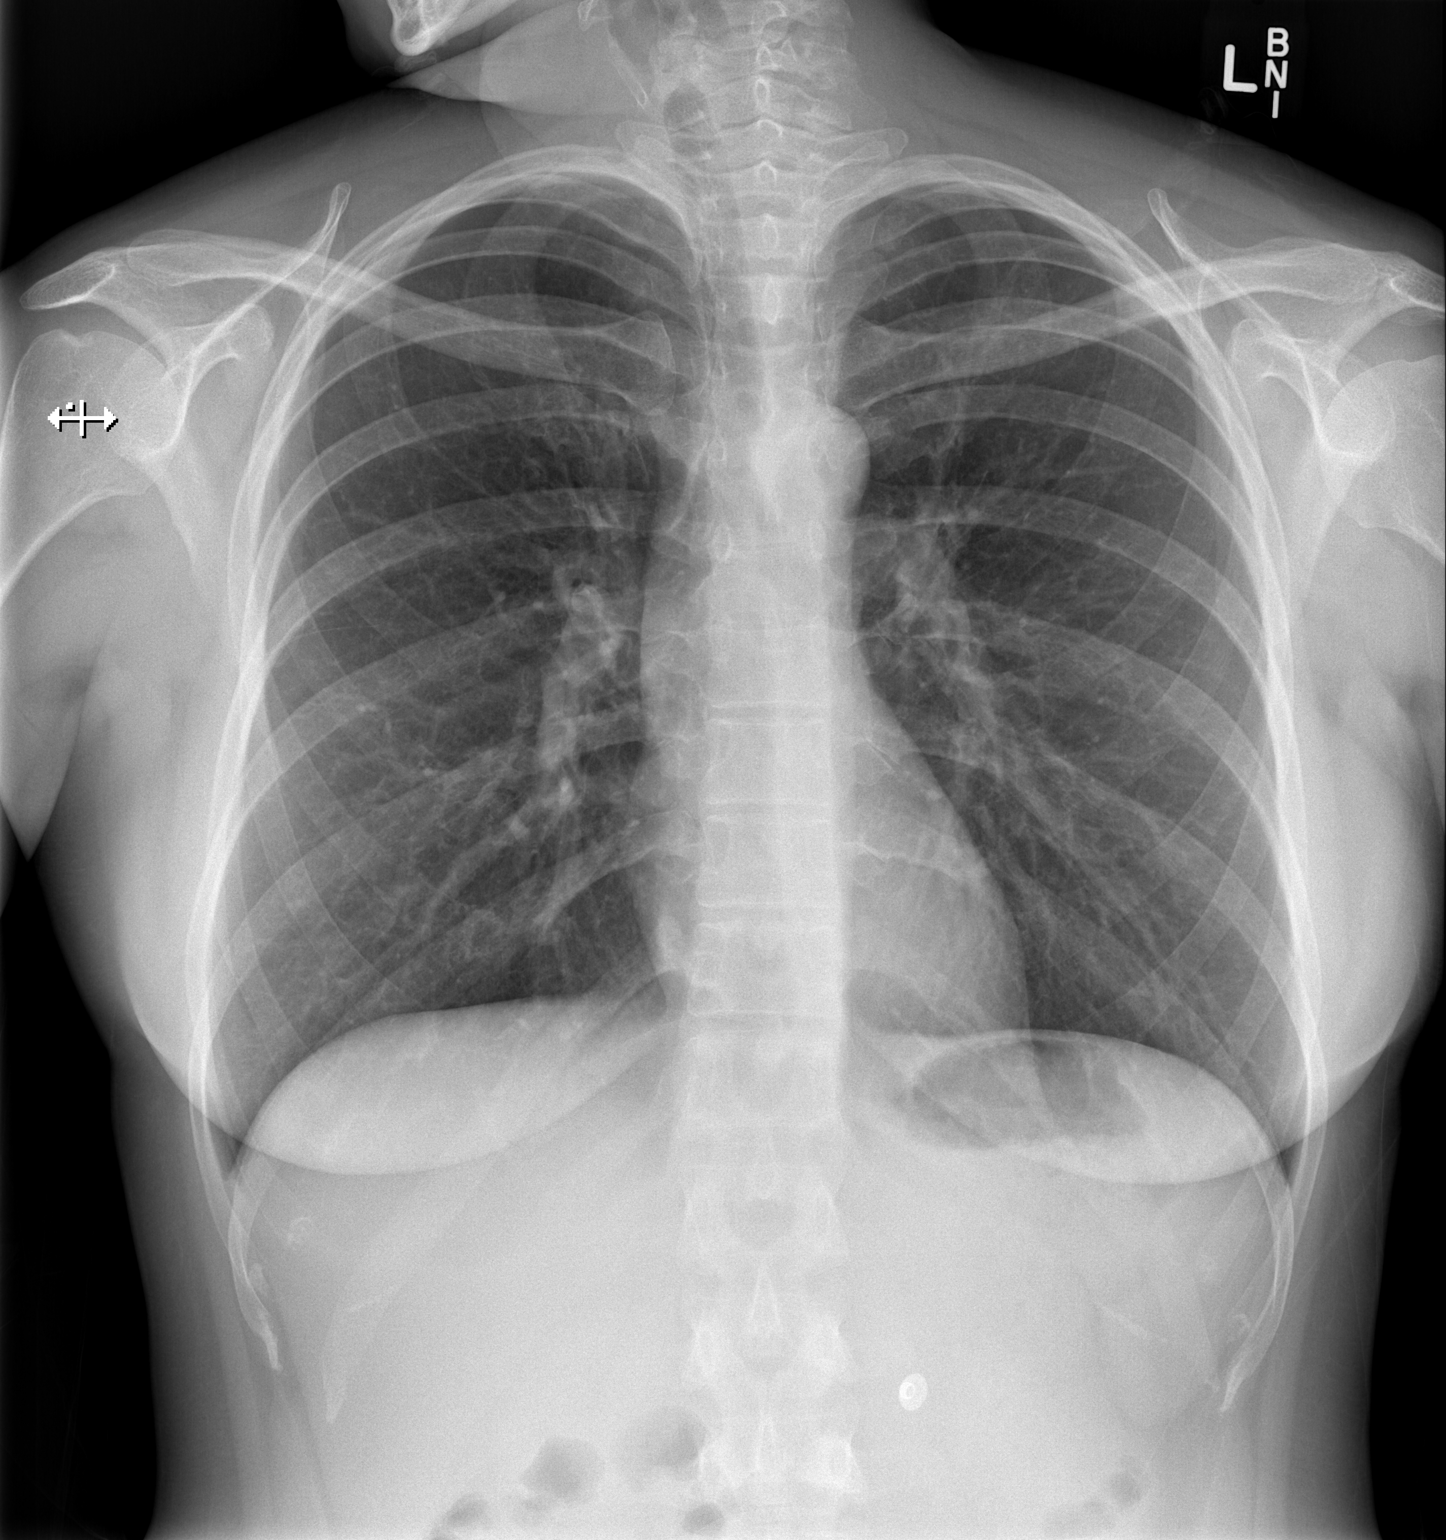

[w chest lat]
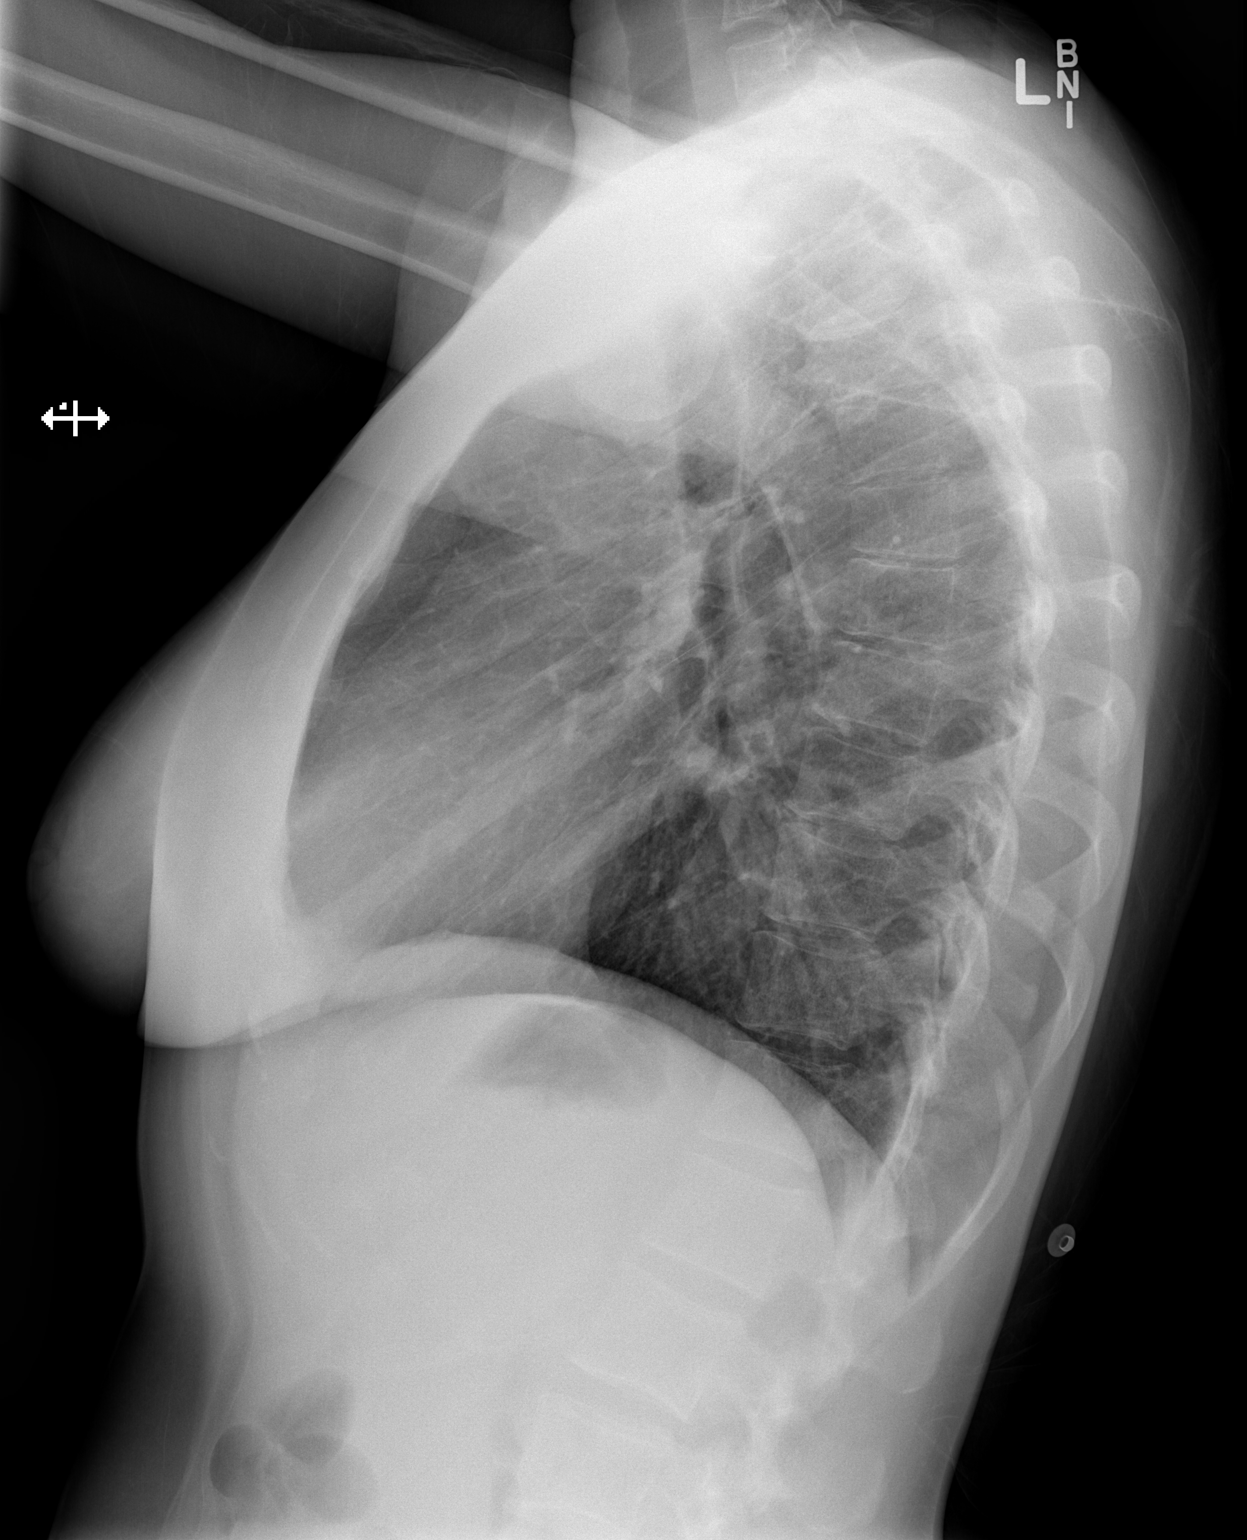

[2 of 2 positions shown; findings below may reference images not displayed]

FINDINGS: The heart size and mediastinal contours are within normal limits.
Both lungs are clear. The visualized skeletal structures are
unremarkable.
IMPRESSION: No active cardiopulmonary disease.
